# Patient Record
Sex: Male | Born: 1960 | Race: White | Hispanic: No | Marital: Married | Smoking: Never smoker
Health system: Southern US, Community
[De-identification: ages and names within clinical notes are randomized; demographics above are authoritative.]

## PROBLEM LIST (undated history)

## (undated) DIAGNOSIS — G4730 Sleep apnea, unspecified: Secondary | ICD-10-CM

## (undated) DIAGNOSIS — K5090 Crohn's disease, unspecified, without complications: Secondary | ICD-10-CM

## (undated) DIAGNOSIS — G4733 Obstructive sleep apnea (adult) (pediatric): Secondary | ICD-10-CM

## (undated) HISTORY — DX: Obstructive sleep apnea (adult) (pediatric): G47.33

## (undated) HISTORY — PX: PROSTATE SURGERY: SHX751

## (undated) HISTORY — PX: OTHER SURGICAL HISTORY: SHX169

## (undated) HISTORY — PX: NASAL SEPTUM SURGERY: SHX37

---

## 2010-07-10 DIAGNOSIS — F419 Anxiety disorder, unspecified: Secondary | ICD-10-CM | POA: Insufficient documentation

## 2013-02-18 DIAGNOSIS — I10 Essential (primary) hypertension: Secondary | ICD-10-CM | POA: Insufficient documentation

## 2018-03-15 ENCOUNTER — Other Ambulatory Visit: Payer: Self-pay

## 2018-03-15 ENCOUNTER — Encounter: Payer: Self-pay | Admitting: Emergency Medicine

## 2018-03-15 ENCOUNTER — Emergency Department
Admission: EM | Admit: 2018-03-15 | Discharge: 2018-03-15 | Disposition: A | Discharge status: Home / Self Care | Payer: BLUE CROSS/BLUE SHIELD | Attending: Emergency Medicine | Admitting: Emergency Medicine

## 2018-03-15 ENCOUNTER — Emergency Department: Payer: BLUE CROSS/BLUE SHIELD

## 2018-03-15 DIAGNOSIS — M5432 Sciatica, left side: Secondary | ICD-10-CM | POA: Diagnosis not present

## 2018-03-15 DIAGNOSIS — M25552 Pain in left hip: Secondary | ICD-10-CM | POA: Diagnosis present

## 2018-03-15 HISTORY — DX: Crohn's disease, unspecified, without complications: K50.90

## 2018-03-15 HISTORY — DX: Sleep apnea, unspecified: G47.30

## 2018-03-15 LAB — URINALYSIS, COMPLETE (UACMP) WITH MICROSCOPIC
Bacteria, UA: NONE SEEN
Bilirubin Urine: NEGATIVE
Glucose, UA: NEGATIVE mg/dL
Hgb urine dipstick: NEGATIVE
Ketones, ur: NEGATIVE mg/dL
LEUKOCYTE UA: NEGATIVE
Nitrite: NEGATIVE
PH: 5 (ref 5.0–8.0)
Protein, ur: NEGATIVE mg/dL
Specific Gravity, Urine: 1.03 (ref 1.005–1.030)

## 2018-03-15 MED ORDER — PREDNISONE 20 MG PO TABS: 60.0000 mg | ORAL_TABLET | Freq: Every day | ORAL | 0 refills | Status: AC

## 2018-03-15 MED ORDER — IBUPROFEN 600 MG PO TABS: 600.0000 mg | ORAL_TABLET | Freq: Four times a day (QID) | ORAL | 0 refills | Status: AC | PRN

## 2018-03-15 MED ORDER — CYCLOBENZAPRINE HCL 10 MG PO TABS
10.0000 mg | ORAL_TABLET | Freq: Once | ORAL | Status: AC
  Administered 2018-03-15: 10 mg via ORAL
  Filled 2018-03-15: qty 1

## 2018-03-15 MED ORDER — CYCLOBENZAPRINE HCL 10 MG PO TABS: 10.0000 mg | ORAL_TABLET | Freq: Three times a day (TID) | ORAL | 0 refills | Status: AC | PRN

## 2018-03-15 MED ORDER — KETOROLAC TROMETHAMINE 60 MG/2ML IM SOLN
30.0000 mg | Freq: Once | INTRAMUSCULAR | Status: AC
  Administered 2018-03-15: 30 mg via INTRAMUSCULAR
  Filled 2018-03-15: qty 2

## 2018-03-15 MED ORDER — PREDNISONE 20 MG PO TABS
60.0000 mg | ORAL_TABLET | Freq: Once | ORAL | Status: AC
  Administered 2018-03-15: 60 mg via ORAL
  Filled 2018-03-15: qty 3

## 2018-03-15 MED ORDER — ACETAMINOPHEN 500 MG PO TABS
1000.0000 mg | ORAL_TABLET | Freq: Once | ORAL | Status: AC
  Administered 2018-03-15: 1000 mg via ORAL
  Filled 2018-03-15: qty 2

## 2018-03-15 NOTE — ED Provider Notes (Signed)
[facility] Emergency Department Provider Note  ____________________________________________  Time seen: Approximately 1:21 AM  I have reviewed the triage vital signs and the nursing notes.   HISTORY  Chief Complaint Hip Pain   HPI Gary Solis is a 58 y.o. male with a history of Crohn's disease, enlarged prostate, sleep apnea who presents for evaluation of left hip pain.  Patient reports the pain started yesterday while he was at work.  No strenuous activities.  The pain is located in the left buttock and radiating down the left leg. The pain has been constant and progressively worse.  At this time is severe.  No back pain, no flank pain, no dysuria or hematuria, no nausea or vomiting, no saddle anesthesia, no lower extremity weakness or numbness, no urinary or bowel incontinence or retention.  Patient reports having had similar pain in the past however never this severe.  Past Medical History:  Diagnosis Date  . Crohn disease ([place])   . Sleep apnea     There are no active problems to display for this patient.   History reviewed. No pertinent surgical history.  Prior to Admission medications   Medication Sig Start Date End Date Taking? Authorizing Provider  cyclobenzaprine (FLEXERIL) 10 MG tablet Take 1 tablet (10 mg total) by mouth 3 (three) times daily as needed for muscle spasms. 03/15/18   Rudene Re, MD  ibuprofen (ADVIL,MOTRIN) 600 MG tablet Take 1 tablet (600 mg total) by mouth every 6 (six) hours as needed. 03/15/18   Rudene Re, MD  predniSONE (DELTASONE) 20 MG tablet Take 3 tablets (60 mg total) by mouth daily for 4 days. 03/15/18 03/19/18  Rudene Re, MD    Allergies Lidocaine  No family history on file.  Social History Social History   Tobacco Use  . Smoking status: Never Smoker  . Smokeless tobacco: Never Used  Substance Use Topics  . Alcohol use: Never    Frequency: Never  . Drug use: Not on file    Review  of Systems  Constitutional: Negative for fever. Eyes: Negative for visual changes. ENT: Negative for sore throat. Neck: No neck pain  Cardiovascular: Negative for chest pain. Respiratory: Negative for shortness of breath. Gastrointestinal: Negative for abdominal pain, vomiting or diarrhea. Genitourinary: Negative for dysuria. Musculoskeletal: Negative for back pain. + left hip pain Skin: Negative for rash. Neurological: Negative for headaches, weakness or numbness. Psych: No SI or HI  ____________________________________________   PHYSICAL EXAM:  VITAL SIGNS: ED Triage Vitals  Enc Vitals Group     BP 03/15/18 0026 123/75     Pulse Rate 03/15/18 0026 90     Resp 03/15/18 0026 18     Temp 03/15/18 0026 98.4 F (36.9 C)     Temp Source 03/15/18 0026 Oral     SpO2 03/15/18 0026 97 %     Weight 03/15/18 0021 225 lb (102.1 kg)     Height 03/15/18 0021 6' 4"  (1.93 m)     Head Circumference --      Peak Flow --      Pain Score 03/15/18 0021 10     Pain Loc --      Pain Edu? --      Excl. in [place]? --     Constitutional: Alert and oriented. Well appearing and in no apparent distress. HEENT:      Head: Normocephalic and atraumatic.         Eyes: Conjunctivae are normal. Sclera is non-icteric.  Mouth/Throat: Mucous membranes are moist.       Neck: Supple with no signs of meningismus. Cardiovascular: Regular rate and rhythm. No murmurs, gallops, or rubs. 2+ symmetrical distal pulses are present in all extremities. No JVD. Respiratory: Normal respiratory effort. Lungs are clear to auscultation bilaterally. No wheezes, crackles, or rhonchi.  Gastrointestinal: Soft, non tender, and non distended with positive bowel sounds. No rebound or guarding. Musculoskeletal: No CT and L-spine tenderness, patient is tender to palpation of the sciatic notch on the left, positive straight leg raise test.  No edema, erythema, or cyanosis of his extremities neurologic: Normal speech and language.  Face is symmetric. Moving all extremities. DTRs 2+ bilateral patella. No gross focal neurologic deficits are appreciated. Skin: Skin is warm, dry and intact. No rash noted. Psychiatric: Mood and affect are normal. Speech and behavior are normal.  ____________________________________________   LABS (all labs ordered are listed, but only abnormal results are displayed)  Labs Reviewed  URINALYSIS, COMPLETE (UACMP) WITH MICROSCOPIC - Abnormal; Notable for the following components:      Result Value   Color, Urine YELLOW (*)    APPearance CLEAR (*)    All other components within normal limits   ____________________________________________  EKG  none  ____________________________________________  RADIOLOGY  I have personally reviewed the images performed during this visit and I agree with the Radiologist's read.   Interpretation by Radiologist:  Dg Sacrum/coccyx  Result Date: 03/15/2018 CLINICAL DATA:  back pain EXAM: SACRUM AND COCCYX - 2+ VIEW COMPARISON:  None. FINDINGS: There is no evidence of fracture or other focal bone lesions. IMPRESSION: Negative. Electronically Signed   By: Donavan Foil M.D.   On: 03/15/2018 01:45   Dg Hip Unilat W Or Wo Pelvis 2-3 Views Left  Result Date: 03/15/2018 CLINICAL DATA:  Low back pain and left hip pain EXAM: DG HIP (WITH OR WITHOUT PELVIS) 2-3V LEFT COMPARISON:  None. FINDINGS: There is no evidence of hip fracture or dislocation. There is no evidence of arthropathy or other focal bone abnormality. IMPRESSION: Negative. Electronically Signed   By: Donavan Foil M.D.   On: 03/15/2018 01:43      ____________________________________________   PROCEDURES  Procedure(s) performed: None Procedures Critical Care performed:  None ____________________________________________   INITIAL IMPRESSION / ASSESSMENT AND PLAN / ED COURSE  58 y.o. male with a history of Crohn's disease, enlarged prostate, sleep apnea who presents for evaluation of left  hip pain.  Presentation concerning for sciatica with tenderness to palpation over the sciatic notch and pain that starts in the left buttock and radiates down to the left leg.  No clinical signs or symptoms of cauda equina.  No midline CT and L-spine tenderness.  Will give prednisone, IM Toradol, Tylenol and Flexeril for pain.  X-rays of sacrum and pelvis with no acute findings.  Patient was given a prescription for ibuprofen, Flexeril, and prednisone.  Discussed return precautions and close follow-up with primary care doctor.      As part of my medical decision making, I reviewed the following data within the [place] notes reviewed and incorporated, Labs reviewed , Old chart reviewed, Radiograph reviewed , Notes from prior ED visits and [place] Controlled Substance Database    Pertinent labs & imaging results that were available during my care of the patient were reviewed by me and considered in my medical decision making (see chart for details).    ____________________________________________   FINAL CLINICAL IMPRESSION(S) / ED DIAGNOSES  Final diagnoses:  Sciatica of left side      NEW MEDICATIONS STARTED DURING THIS VISIT:  ED Discharge Orders         Ordered    predniSONE (DELTASONE) 20 MG tablet  Daily     03/15/18 0206    cyclobenzaprine (FLEXERIL) 10 MG tablet  3 times daily PRN     03/15/18 0206    ibuprofen (ADVIL,MOTRIN) 600 MG tablet  Every 6 hours PRN     03/15/18 0206           Note:  This document was prepared using Dragon voice recognition software and may include unintentional dictation errors.    Rudene Re, MD 03/15/18 [phone]

## 2018-03-15 NOTE — ED Triage Notes (Signed)
Patient ambulatory to triage with steady gait, without difficulty or distress noted, aid of walker; c/o left hip pain radiating down leg into foot

## 2019-01-12 ENCOUNTER — Encounter: Payer: Self-pay | Admitting: Emergency Medicine

## 2019-01-12 ENCOUNTER — Emergency Department
Admission: EM | Admit: 2019-01-12 | Discharge: 2019-01-12 | Disposition: A | Discharge status: Home / Self Care | Payer: BC Managed Care – PPO | Attending: Emergency Medicine | Admitting: Emergency Medicine

## 2019-01-12 ENCOUNTER — Emergency Department: Payer: BC Managed Care – PPO

## 2019-01-12 ENCOUNTER — Other Ambulatory Visit: Payer: Self-pay

## 2019-01-12 DIAGNOSIS — J189 Pneumonia, unspecified organism: Secondary | ICD-10-CM | POA: Insufficient documentation

## 2019-01-12 DIAGNOSIS — Z20822 Contact with and (suspected) exposure to covid-19: Secondary | ICD-10-CM

## 2019-01-12 DIAGNOSIS — U071 COVID-19: Secondary | ICD-10-CM | POA: Insufficient documentation

## 2019-01-12 DIAGNOSIS — R05 Cough: Secondary | ICD-10-CM | POA: Diagnosis present

## 2019-01-12 MED ORDER — CEFTRIAXONE SODIUM 1 G IJ SOLR
1.0000 g | Freq: Once | INTRAMUSCULAR | Status: AC
  Administered 2019-01-12: 19:00:00 1 g via INTRAMUSCULAR
  Filled 2019-01-12: qty 10

## 2019-01-12 MED ORDER — BENZONATATE 100 MG PO CAPS: 100.0000 mg | ORAL_CAPSULE | Freq: Three times a day (TID) | ORAL | 0 refills | Status: AC | PRN

## 2019-01-12 MED ORDER — AZITHROMYCIN 250 MG PO TABS: ORAL_TABLET | ORAL | 0 refills | Status: AC

## 2019-01-12 NOTE — ED Provider Notes (Signed)
[facility] Emergency Department Provider Note  ____________________________________________  Time seen: Approximately 6:58 PM  I have reviewed the triage vital signs and the nursing notes.   HISTORY  Chief Complaint Cough    HPI Gary Solis is a 59 y.o. male that presents to the emergency department for evaluation of body aches and nonproductive cough for 3 days.  Patient states that he started feeling a little achy on December 25 but symptoms really started on New Year's Eve.  Patient is otherwise healthy.  He does not smoke.  No known contacts with COVID-19.  He presents to the emergency department today with his wife with similar symptoms.  He is unaware of a fever.  No headache, shortness of breath, chest pain, vomiting, abdominal pain, diarrhea.  Past Medical History:  Diagnosis Date  . Crohn disease ([place])   . Sleep apnea     There are no problems to display for this patient.   History reviewed. No pertinent surgical history.  Prior to Admission medications   Medication Sig Start Date End Date Taking? Authorizing Provider  azithromycin (ZITHROMAX Z-PAK) 250 MG tablet Take 2 tablets (500 mg) on  Day 1,  followed by 1 tablet (250 mg) once daily on Days 2 through 5. 01/13/19   Laban Emperor, PA-C  benzonatate (TESSALON PERLES) 100 MG capsule Take 1 capsule (100 mg total) by mouth 3 (three) times daily as needed. 01/12/19 01/12/20  Laban Emperor, PA-C  cyclobenzaprine (FLEXERIL) 10 MG tablet Take 1 tablet (10 mg total) by mouth 3 (three) times daily as needed for muscle spasms. 03/15/18   Rudene Re, MD  ibuprofen (ADVIL,MOTRIN) 600 MG tablet Take 1 tablet (600 mg total) by mouth every 6 (six) hours as needed. 03/15/18   Rudene Re, MD    Allergies Lidocaine  No family history on file.  Social History Social History   Tobacco Use  . Smoking status: Never Smoker  . Smokeless tobacco: Never Used  Substance Use Topics  . Alcohol use:  Never  . Drug use: Not on file     Review of Systems  Constitutional: No fever/chills Eyes: No visual changes. No discharge. ENT: Negative for congestion and rhinorrhea. Cardiovascular: No chest pain. Respiratory: Positive for cough. No SOB. Gastrointestinal: No abdominal pain.  No nausea, no vomiting.  No diarrhea.  No constipation. Musculoskeletal: Negative for musculoskeletal pain. Skin: Negative for rash, abrasions, lacerations, ecchymosis. Neurological: Negative for headaches.   ____________________________________________   PHYSICAL EXAM:  VITAL SIGNS: ED Triage Vitals  Enc Vitals Group     BP 01/12/19 1624 137/70     Pulse Rate 01/12/19 1624 (!) 109     Resp 01/12/19 1624 20     Temp 01/12/19 1624 99.1 F (37.3 C)     Temp Source 01/12/19 1624 Oral     SpO2 01/12/19 1624 95 %     Weight 01/12/19 1630 232 lb (105.2 kg)     Height 01/12/19 1630 6' 4"  (1.93 m)     Head Circumference --      Peak Flow --      Pain Score 01/12/19 1630 6     Pain Loc --      Pain Edu? --      Excl. in [place]? --      Constitutional: Alert and oriented. Well appearing and in no acute distress. Eyes: Conjunctivae are normal. PERRL. EOMI. No discharge. Head: Atraumatic. ENT: No frontal and maxillary sinus tenderness.      Ears:  Tympanic membranes pearly gray with good landmarks. No discharge.      Nose: No congestion/rhinnorhea.      Mouth/Throat: Mucous membranes are moist. Oropharynx non-erythematous. Tonsils not enlarged. No exudates. Uvula midline. Neck: No stridor.   Hematological/Lymphatic/Immunilogical: No cervical lymphadenopathy. Cardiovascular: Normal rate, regular rhythm.  Good peripheral circulation. Respiratory: Normal respiratory effort without tachypnea or retractions. Lungs CTAB. Good air entry to the bases with no decreased or absent breath sounds. Gastrointestinal: Bowel sounds 4 quadrants. Soft and nontender to palpation. No guarding or rigidity. No palpable masses.  No distention. Musculoskeletal: Full range of motion to all extremities. No gross deformities appreciated. Neurologic:  Normal speech and language. No gross focal neurologic deficits are appreciated.  Skin:  Skin is warm, dry and intact. No rash noted. Psychiatric: Mood and affect are normal. Speech and behavior are normal. Patient exhibits appropriate insight and judgement.   ____________________________________________   LABS (all labs ordered are listed, but only abnormal results are displayed)  Labs Reviewed  SARS CORONAVIRUS 2 (TAT 6-24 HRS)   ____________________________________________  EKG   ____________________________________________  RADIOLOGY Robinette Haines, personally viewed and evaluated these images (plain radiographs) as part of my medical decision making, as well as reviewing the written report by the radiologist.  DG Chest Portable 1 View  Result Date: 01/12/2019 CLINICAL DATA:  Cough. Additional provided: Cough beginning 01/04/2019. EXAM: PORTABLE CHEST 1 VIEW COMPARISON:  No pertinent prior studies available for comparison. FINDINGS: Heart size within normal limits. Mild ill-defined opacity at the right lung base may reflect atelectasis or early pneumonia. The left lung is clear. No evidence of pleural effusion or pneumothorax. No acute bony abnormality. IMPRESSION: Ill-defined opacity at the right lung base which may reflect atelectasis, but is suspicious for early pneumonia given provided history. Electronically Signed   By: Kellie Simmering DO   On: 01/12/2019 17:59    ____________________________________________    PROCEDURES  Procedure(s) performed:    Procedures    Medications  cefTRIAXone (ROCEPHIN) injection 1 g (1 g Intramuscular Given 01/12/19 1912)     ____________________________________________   INITIAL IMPRESSION / ASSESSMENT AND PLAN / ED COURSE  Pertinent labs & imaging results that were available during my care of the patient were  reviewed by me and considered in my medical decision making (see chart for details).  Review of the [place] CSRS was performed in accordance of the [place] prior to dispensing any controlled drugs.   Patient's diagnosis is consistent with community acquired pneumonia. Vital signs and exam are reassuring.  X-ray consistent with possible early right-sided pneumonia.  Covid test is pending.  Patient denies any shortness of breath or chest pain.  Patient presents with his wife who also has URI symptoms.  Patient appears well and is staying well hydrated. Patient feels comfortable going home.  Patient was given IM ceftriaxone in the emergency department.  Patient will be discharged home with prescriptions for azithromycin and Tessalon Perles. Patient is to follow up with primary care as needed or otherwise directed. Patient is given ED precautions to return to the ED for any worsening or new symptoms.   Kiah Keay was evaluated in Emergency Department on 01/12/2019 for the symptoms described in the history of present illness. He was evaluated in the context of the global COVID-19 pandemic, which necessitated consideration that the patient might be at risk for infection with the SARS-CoV-2 virus that causes COVID-19. Institutional protocols and algorithms that pertain to the evaluation of patients at risk for COVID-19 are  in a state of rapid change based on information released by regulatory bodies including the CDC and federal and state organizations. These policies and algorithms were followed during the patient's care in the ED.  ____________________________________________  FINAL CLINICAL IMPRESSION(S) / ED DIAGNOSES  Final diagnoses:  Community acquired pneumonia of right lower lobe of lung  Suspected COVID-19 virus infection      NEW MEDICATIONS STARTED DURING THIS VISIT:  ED Discharge Orders         Ordered    azithromycin (ZITHROMAX Z-PAK) 250 MG tablet     01/12/19 1930    benzonatate  (TESSALON PERLES) 100 MG capsule  3 times daily PRN     01/12/19 1930              This chart was dictated using voice recognition software/Dragon. Despite best efforts to proofread, errors can occur which can change the meaning. Any change was purely unintentional.    Laban Emperor, PA-C 01/12/19 2344    Duffy Bruce, MD 01/14/19 [phone]

## 2019-01-12 NOTE — Discharge Instructions (Addendum)
Your chest x-ray shows that you may be starting to develop some pneumonia.   I suspect that you have Covid and that your pneumonia is due to Covid, rather than a bacterial pneumonia. Please begin the azithromycin in the morning to cover you for a possible bacterial pneumonia.  I have given you a dose of antibiotics here in the emergency department.  Please call your primary care on Monday for a follow-up appointment.  Return the emergency department for worsening of symptoms.

## 2019-01-12 NOTE — ED Triage Notes (Signed)
Cough since 12/25

## 2019-01-13 LAB — SARS CORONAVIRUS 2 (TAT 6-24 HRS): SARS Coronavirus 2: POSITIVE — AB

## 2019-01-14 ENCOUNTER — Telehealth: Payer: Self-pay | Admitting: Emergency Medicine

## 2019-01-14 NOTE — Telephone Encounter (Signed)
Called patient to assure he is aware of covid.  Says  He is aware as pcp and health dept called him.

## 2019-10-01 ENCOUNTER — Ambulatory Visit (INDEPENDENT_AMBULATORY_CARE_PROVIDER_SITE_OTHER): Payer: BC Managed Care – PPO | Admitting: Internal Medicine

## 2019-10-01 DIAGNOSIS — Z9989 Dependence on other enabling machines and devices: Secondary | ICD-10-CM | POA: Diagnosis not present

## 2019-10-01 DIAGNOSIS — Z7189 Other specified counseling: Secondary | ICD-10-CM

## 2019-10-01 DIAGNOSIS — G4733 Obstructive sleep apnea (adult) (pediatric): Secondary | ICD-10-CM | POA: Diagnosis not present

## 2019-10-01 MED ORDER — ARMODAFINIL 200 MG PO TABS: 1.0000 | ORAL_TABLET | Freq: Every day | ORAL | 1 refills | Status: DC

## 2019-10-01 MED ORDER — MODAFINIL 200 MG PO TABS: 200.0000 mg | ORAL_TABLET | Freq: Every day | ORAL | 1 refills | Status: DC

## 2019-10-01 NOTE — Progress Notes (Signed)
[facility] [place], [place] [address]  Pulmonary Sleep Medicine   Office Visit Note  Patient Name: Gary Solis DOB: 02-03-1960 MRN [id number]    Chief Complaint: Obstructive Sleep Apnea/Medication visit  Brief History:  [place] is seen today for follow up The patient has a 11 year history of sleep apnea. Patient is using PAP nightly.  He sometimes he wakes with the mask off.  He goes to bed around 10-10:30 p.m. and wakes a little after 5 p.m.The patient feels more rested after sleeping with PAP.  The patient reports benefiting  from PAP use. Reported sleepiness is  improved and the Epworth Sleepiness Score is 4 out of 24. The patient rarely take naps. The patient complains of the following: no complaints  The compliance is not available The patient does not complain of limb movements disrupting sleep. The patient takes Nuvigil 200 mg qam for hypersomnia. He denies any side effects. ROS  General: (-) fever, (-) chills, (-) night sweat Nose and Sinuses: (-) nasal stuffiness or itchiness, (-) postnasal drip, (-) nosebleeds, (-) sinus trouble. Mouth and Throat: (-) sore throat, (-) hoarseness. Neck: (-) swollen glands, (-) enlarged thyroid, (-) neck pain. Respiratory: - cough, - shortness of breath, - wheezing. Neurologic: - numbness, - tingling. Psychiatric: - anxiety, - depression   Current Medication: Outpatient Encounter Medications as of 10/01/2019  Medication Sig  . azithromycin (ZITHROMAX Z-PAK) 250 MG tablet Take 2 tablets (500 mg) on  Day 1,  followed by 1 tablet (250 mg) once daily on Days 2 through 5.  . benzonatate (TESSALON PERLES) 100 MG capsule Take 1 capsule (100 mg total) by mouth 3 (three) times daily as needed.  . cyclobenzaprine (FLEXERIL) 10 MG tablet Take 1 tablet (10 mg total) by mouth 3 (three) times daily as needed for muscle spasms.  Marland Kitchen ibuprofen (ADVIL,MOTRIN) 600 MG tablet Take 1 tablet (600 mg total) by mouth every 6 (six) hours  as needed.   No facility-administered encounter medications on file as of 10/01/2019.    Surgical History: No past surgical history on file.  Medical History: Past Medical History:  Diagnosis Date  . Crohn disease ([place])   . Sleep apnea     Family History: Non contributory to the present illness  Social History: Social History   Socioeconomic History  . Marital status: Married    Spouse name: Not on file  . Number of children: Not on file  . Years of education: Not on file  . Highest education level: Not on file  Occupational History  . Not on file  Tobacco Use  . Smoking status: Never Smoker  . Smokeless tobacco: Never Used  Substance and Sexual Activity  . Alcohol use: Never  . Drug use: Not on file  . Sexual activity: Not on file  Other Topics Concern  . Not on file  Social History Narrative  . Not on file   Social Determinants of Health   Financial Resource Strain:   . Difficulty of Paying Living Expenses: Not on file  Food Insecurity:   . Worried About Charity fundraiser in the Last Year: Not on file  . Ran Out of Food in the Last Year: Not on file  Transportation Needs:   . Lack of Transportation (Medical): Not on file  . Lack of Transportation (Non-Medical): Not on file  Physical Activity:   . Days of Exercise per Week: Not on file  . Minutes of Exercise per Session: Not on  file  Stress:   . Feeling of Stress : Not on file  Social Connections:   . Frequency of Communication with Friends and Family: Not on file  . Frequency of Social Gatherings with Friends and Family: Not on file  . Attends Religious Services: Not on file  . Active Member of Clubs or Organizations: Not on file  . Attends Archivist Meetings: Not on file  . Marital Status: Not on file  Intimate Partner Violence:   . Fear of Current or Ex-Partner: Not on file  . Emotionally Abused: Not on file  . Physically Abused: Not on file  . Sexually Abused: Not on file    Vital  Signs: There were no vitals taken for this visit.  Examination: General Appearance: The patient is well-developed, well-nourished, and in no distress. Neck Circumference: 40 Skin: Gross inspection of skin unremarkable. Head: normocephalic, no gross deformities. Eyes: no gross deformities noted. ENT: ears appear grossly normal Neurologic: Alert and oriented. No involuntary movements.    EPWORTH SLEEPINESS SCALE:  Scale:  (0)= no chance of dozing; (1)= slight chance of dozing; (2)= moderate chance of dozing; (3)= high chance of dozing  Chance  Situtation    Sitting and reading: 1    Watching TV: 1    Sitting Inactive in public: 1    As a passenger in car: 1      Lying down to rest: 0    Sitting and talking: 0    Sitting quielty after lunch: 0    In a car, stopped in traffic: 0   TOTAL SCORE:   4 out of 24    SLEEP STUDIES:  1. Split 05/2007 AH 37 SpO45mn 70%   CPAP COMPLIANCE DATA:  Not available        LABS: No results found for this or any previous visit (from the past 2160 hour(s)).  Radiology: DG Chest Portable 1 View  Result Date: 01/12/2019 CLINICAL DATA:  Cough. Additional provided: Cough beginning 01/04/2019. EXAM: PORTABLE CHEST 1 VIEW COMPARISON:  No pertinent prior studies available for comparison. FINDINGS: Heart size within normal limits. Mild ill-defined opacity at the right lung base may reflect atelectasis or early pneumonia. The left lung is clear. No evidence of pleural effusion or pneumothorax. No acute bony abnormality. IMPRESSION: Ill-defined opacity at the right lung base which may reflect atelectasis, but is suspicious for early pneumonia given provided history. Electronically Signed   By: KKellie SimmeringDO   On: 01/12/2019 17:59    No results found.  No results found.    Assessment and Plan: There are no problems to display for this patient.     The patient does tolerate PAP for the most part. He still removes his mask  during the night some nights.  He will try practicing with the CPAP during the day. He reports significant benefit from PAP use. The patient was reminded how to clean and advised to ensure adequate nightly sleep. The patient was also counselled on the importance of diet and exercise. The compliance is not available. He is taking Provigil for hypersomnia and this has been very effective.   1. OSA- increase CPAP usage and practice with CPAP 30 minutes per day. 2. Hypersomnia- Continue Provigil 200 mg qam. Follow up in 6 months 3. CPAP counseling provided 4. Obesity diet and exercise discussed  General Counseling: I have discussed the findings of the evaluation and [facility] with [facility]  I have also discussed any further diagnostic evaluation thatmay  be needed or ordered today. Jermain verbalizes understanding of the findings of todays visit. We also reviewed his medications today and discussed drug interactions and side effects including but not limited excessive drowsiness and altered mental states. We also discussed that there is always a risk not just to him but also people around him. he has been encouraged to call the office with any questions or concerns that should arise related to todays visit.  No orders of the defined types were placed in this encounter.       I have personally obtained a history, examined the patient, evaluated laboratory and imaging results, formulated the assessment and plan and placed orders.   Richelle Ito Saunders Glance, PhD, FAASM  Diplomate, American Board of Sleep Medicine    Allyne Gee, MD [facility] Diplomate ABMS Pulmonary and Critical Care Medicine Sleep medicine

## 2020-03-27 NOTE — Progress Notes (Unsigned)
[facility] [place], [place] [address]  Pulmonary Sleep Medicine   Office Visit Note  Patient Name: Gary Solis DOB: 07-Oct-1960 MRN [id number]    Chief Complaint: Obstructive Sleep Apnea visit  Brief History:  Filbert is seen today for follow up The patient has a 6 year history of sleep apnea. Patient is using PAP nightly.  He reports averaging 6-7 hours of sleep The patient feels more rested after sleeping with PAP.  The patient reports benefiting from PAP use. He is still waking up with the mask on the floor. He is still taking armodafinil 200 mg qam. Reported sleepiness is  improved and the Epworth Sleepiness Score is 1 out of 24. The patient does not take naps. The patient complains of the following: no complaints.  The compliance is not available. The patient does not complain of limb movements disrupting sleep.  ROS  General: (-) fever, (-) chills, (-) night sweat Nose and Sinuses: (-) nasal stuffiness or itchiness, (-) postnasal drip, (-) nosebleeds, (-) sinus trouble. Mouth and Throat: (-) sore throat, (-) hoarseness. Neck: (-) swollen glands, (-) enlarged thyroid, (-) neck pain. Respiratory: - cough, - shortness of breath, - wheezing. Neurologic: - numbness, - tingling. Psychiatric: - anxiety, - depression   Current Medication: Outpatient Encounter Medications as of 03/31/2020  Medication Sig  . amitriptyline (ELAVIL) 25 MG tablet Take by mouth.  . Armodafinil (NUVIGIL) 200 MG TABS Take 1 tablet by mouth daily.  Marland Kitchen azaTHIOprine (IMURAN) 50 MG tablet Take by mouth.  Marland Kitchen azithromycin (ZITHROMAX Z-PAK) 250 MG tablet Take 2 tablets (500 mg) on  Day 1,  followed by 1 tablet (250 mg) once daily on Days 2 through 5.  . cyclobenzaprine (FLEXERIL) 10 MG tablet Take 1 tablet (10 mg total) by mouth 3 (three) times daily as needed for muscle spasms.  Marland Kitchen ibuprofen (ADVIL,MOTRIN) 600 MG tablet Take 1 tablet (600 mg total) by mouth every 6 (six) hours as  needed.  Marland Kitchen lisinopril (ZESTRIL) 10 MG tablet Take 10 mg by mouth daily.  Marland Kitchen sulfaSALAzine (AZULFIDINE) 500 MG tablet Take by mouth.  . [DISCONTINUED] Armodafinil 200 MG TABS Take 1 tablet by mouth every morning.  . [DISCONTINUED] modafinil (PROVIGIL) 200 MG tablet Take 1 tablet (200 mg total) by mouth daily.   No facility-administered encounter medications on file as of 03/31/2020.    Surgical History: Past Surgical History:  Procedure Laterality Date  . hemmorrhoid    . NASAL SEPTUM SURGERY    . PROSTATE SURGERY      Medical History: Past Medical History:  Diagnosis Date  . Crohn disease ([place])   . OSA on CPAP   . Sleep apnea     Family History: Non contributory to the present illness  Social History: Social History   Socioeconomic History  . Marital status: Married    Spouse name: Not on file  . Number of children: Not on file  . Years of education: Not on file  . Highest education level: Not on file  Occupational History  . Not on file  Tobacco Use  . Smoking status: Never Smoker  . Smokeless tobacco: Never Used  Substance and Sexual Activity  . Alcohol use: Never  . Drug use: Never  . Sexual activity: Not on file  Other Topics Concern  . Not on file  Social History Narrative  . Not on file   Social Determinants of Health   Financial Resource Strain: Not on file  Food Insecurity: Not  on file  Transportation Needs: Not on file  Physical Activity: Not on file  Stress: Not on file  Social Connections: Not on file  Intimate Partner Violence: Not on file    Vital Signs: Blood pressure (!) 142/86, pulse 91, temperature 98.6 F (37 C), resp. rate 18, height 6' 4"  (1.93 m), weight 229 lb (103.9 kg), SpO2 96 %.  Examination: General Appearance: The patient is well-developed, well-nourished, and in no distress. Neck Circumference: 43 cm Skin: Gross inspection of skin unremarkable. Head: normocephalic, no gross deformities. Eyes: no gross deformities  noted. ENT: ears appear grossly normal Neurologic: Alert and oriented. No involuntary movements.    EPWORTH SLEEPINESS SCALE:  Scale:  (0)= no chance of dozing; (1)= slight chance of dozing; (2)= moderate chance of dozing; (3)= high chance of dozing  Chance  Situtation    Sitting and reading: 0    Watching TV: 1    Sitting Inactive in public: 0    As a passenger in car: 0      Lying down to rest: 1    Sitting and talking: 0    Sitting quielty after lunch: 0    In a car, stopped in traffic: 0   TOTAL SCORE:   1 out of 24    SLEEP STUDIES:  1. Split 5/09 AHI 37 Spo73mn 70%   CPAP COMPLIANCE DATA:  Not available due to aged machine  95th Percentile Pressure: 16         LABS: No results found for this or any previous visit (from the past 2160 hour(s)).  Radiology: DG Chest Portable 1 View  Result Date: 01/12/2019 CLINICAL DATA:  Cough. Additional provided: Cough beginning 01/04/2019. EXAM: PORTABLE CHEST 1 VIEW COMPARISON:  No pertinent prior studies available for comparison. FINDINGS: Heart size within normal limits. Mild ill-defined opacity at the right lung base may reflect atelectasis or early pneumonia. The left lung is clear. No evidence of pleural effusion or pneumothorax. No acute bony abnormality. IMPRESSION: Ill-defined opacity at the right lung base which may reflect atelectasis, but is suspicious for early pneumonia given provided history. Electronically Signed   By: KKellie SimmeringDO   On: 01/12/2019 17:59    No results found.  No results found.    Assessment and Plan: Patient Active Problem List   Diagnosis Date Noted  . OSA on CPAP   . Essential hypertension 02/18/2013  . Anxiety 07/10/2010      The patient does tolerate PAP although he is still removing the mask unknowingly some nights. He does not want to switch to an oral appliance. and reports significant benefit from PAP use. The patient was urged to replace his aged CPAP as we  cannot get any information from his CPAP. The compliance is not available He continues to do well on armodafinil 200 mg qam and is in need of a refill.  1. OSA on CPAP continue to use CPAP nightly, all night.  2. CPAP use counseling CPAP couseling-Discussed importance of adequate CPAP use as well as proper care and cleaning techniques of machine and all supplies.  3. Hypersomnia Continue Armodafinil 2037mq AM, refill sent  4. Essential hypertension Mildly elevated. Continue current medication and f/u with PCP.  5. Anxiety Stable, continue current medication and f/u with PCP.   General Counseling: I have discussed the findings of the evaluation [facility] I have also discussed any further diagnostic evaluation thatmay be needed or ordered today. GlKenrickerbalizes understanding of  the findings of todays visit. We also reviewed his medications today and discussed drug interactions and side effects including but not limited excessive drowsiness and altered mental states. We also discussed that there is always a risk not just to him but also people around him. he has been encouraged to call the office with any questions or concerns that should arise related to todays visit.  No orders of the defined types were placed in this encounter.      I have personally obtained a history, examined the patient, evaluated laboratory and imaging results, formulated the assessment and plan and placed orders.  This patient was seen by Drema Dallas, PA-C in collaboration with Dr. Devona Konig as a part of collaborative care agreement.   Richelle Ito Saunders Glance, PhD, FAASM  Diplomate, American Board of Sleep Medicine    Allyne Gee, MD [facility] Diplomate ABMS Pulmonary and Critical Care Medicine Sleep medicine

## 2020-03-31 ENCOUNTER — Other Ambulatory Visit: Payer: Self-pay | Admitting: Hospice and Palliative Medicine

## 2020-03-31 ENCOUNTER — Ambulatory Visit (INDEPENDENT_AMBULATORY_CARE_PROVIDER_SITE_OTHER): Payer: BC Managed Care – PPO | Admitting: Internal Medicine

## 2020-03-31 DIAGNOSIS — I10 Essential (primary) hypertension: Secondary | ICD-10-CM | POA: Diagnosis not present

## 2020-03-31 DIAGNOSIS — G4733 Obstructive sleep apnea (adult) (pediatric): Secondary | ICD-10-CM

## 2020-03-31 DIAGNOSIS — F419 Anxiety disorder, unspecified: Secondary | ICD-10-CM

## 2020-03-31 DIAGNOSIS — Z9989 Dependence on other enabling machines and devices: Secondary | ICD-10-CM

## 2020-03-31 DIAGNOSIS — G4710 Hypersomnia, unspecified: Secondary | ICD-10-CM | POA: Diagnosis not present

## 2020-03-31 DIAGNOSIS — Z7189 Other specified counseling: Secondary | ICD-10-CM

## 2020-03-31 MED ORDER — ARMODAFINIL 200 MG PO TABS: 1.0000 | ORAL_TABLET | Freq: Every day | ORAL | 1 refills | Status: DC

## 2020-03-31 NOTE — Patient Instructions (Signed)

## 2020-04-03 MED ORDER — ARMODAFINIL 200 MG PO TABS: 1.0000 | ORAL_TABLET | Freq: Every day | ORAL | 1 refills | Status: DC

## 2020-04-07 ENCOUNTER — Other Ambulatory Visit: Payer: Self-pay | Admitting: Hospice and Palliative Medicine

## 2020-04-07 DIAGNOSIS — G4710 Hypersomnia, unspecified: Secondary | ICD-10-CM

## 2020-04-07 MED ORDER — ARMODAFINIL 200 MG PO TABS: 1.0000 | ORAL_TABLET | Freq: Every day | ORAL | 1 refills | Status: DC

## 2020-04-08 ENCOUNTER — Other Ambulatory Visit: Payer: Self-pay | Admitting: Physician Assistant

## 2020-04-08 DIAGNOSIS — G4710 Hypersomnia, unspecified: Secondary | ICD-10-CM

## 2020-04-08 MED ORDER — ARMODAFINIL 200 MG PO TABS: 1.0000 | ORAL_TABLET | Freq: Every day | ORAL | 1 refills | Status: DC

## 2020-04-09 ENCOUNTER — Other Ambulatory Visit: Payer: Self-pay | Admitting: Hospice and Palliative Medicine

## 2020-04-09 ENCOUNTER — Telehealth: Payer: Self-pay

## 2020-04-09 ENCOUNTER — Other Ambulatory Visit: Payer: Self-pay

## 2020-04-09 NOTE — Telephone Encounter (Signed)
Called [facility]  And canceled pres for Amodafinil and [facility] ok to [facility] and also advised pt that pres was ready for pickup at [facility]

## 2020-09-07 IMAGING — DX DG CHEST 1V PORT
1 series · 2 of 2 positions shown · non-contrast
Comparison: No pertinent prior studies available for comparison.

CLINICAL DATA: Cough. Additional provided: Cough beginning
01/04/2019.

EXAM:
PORTABLE CHEST 1 VIEW

[Series 1: chest ap · 0.14mm/px · 2 of 2 slices shown]
[im 1/2]
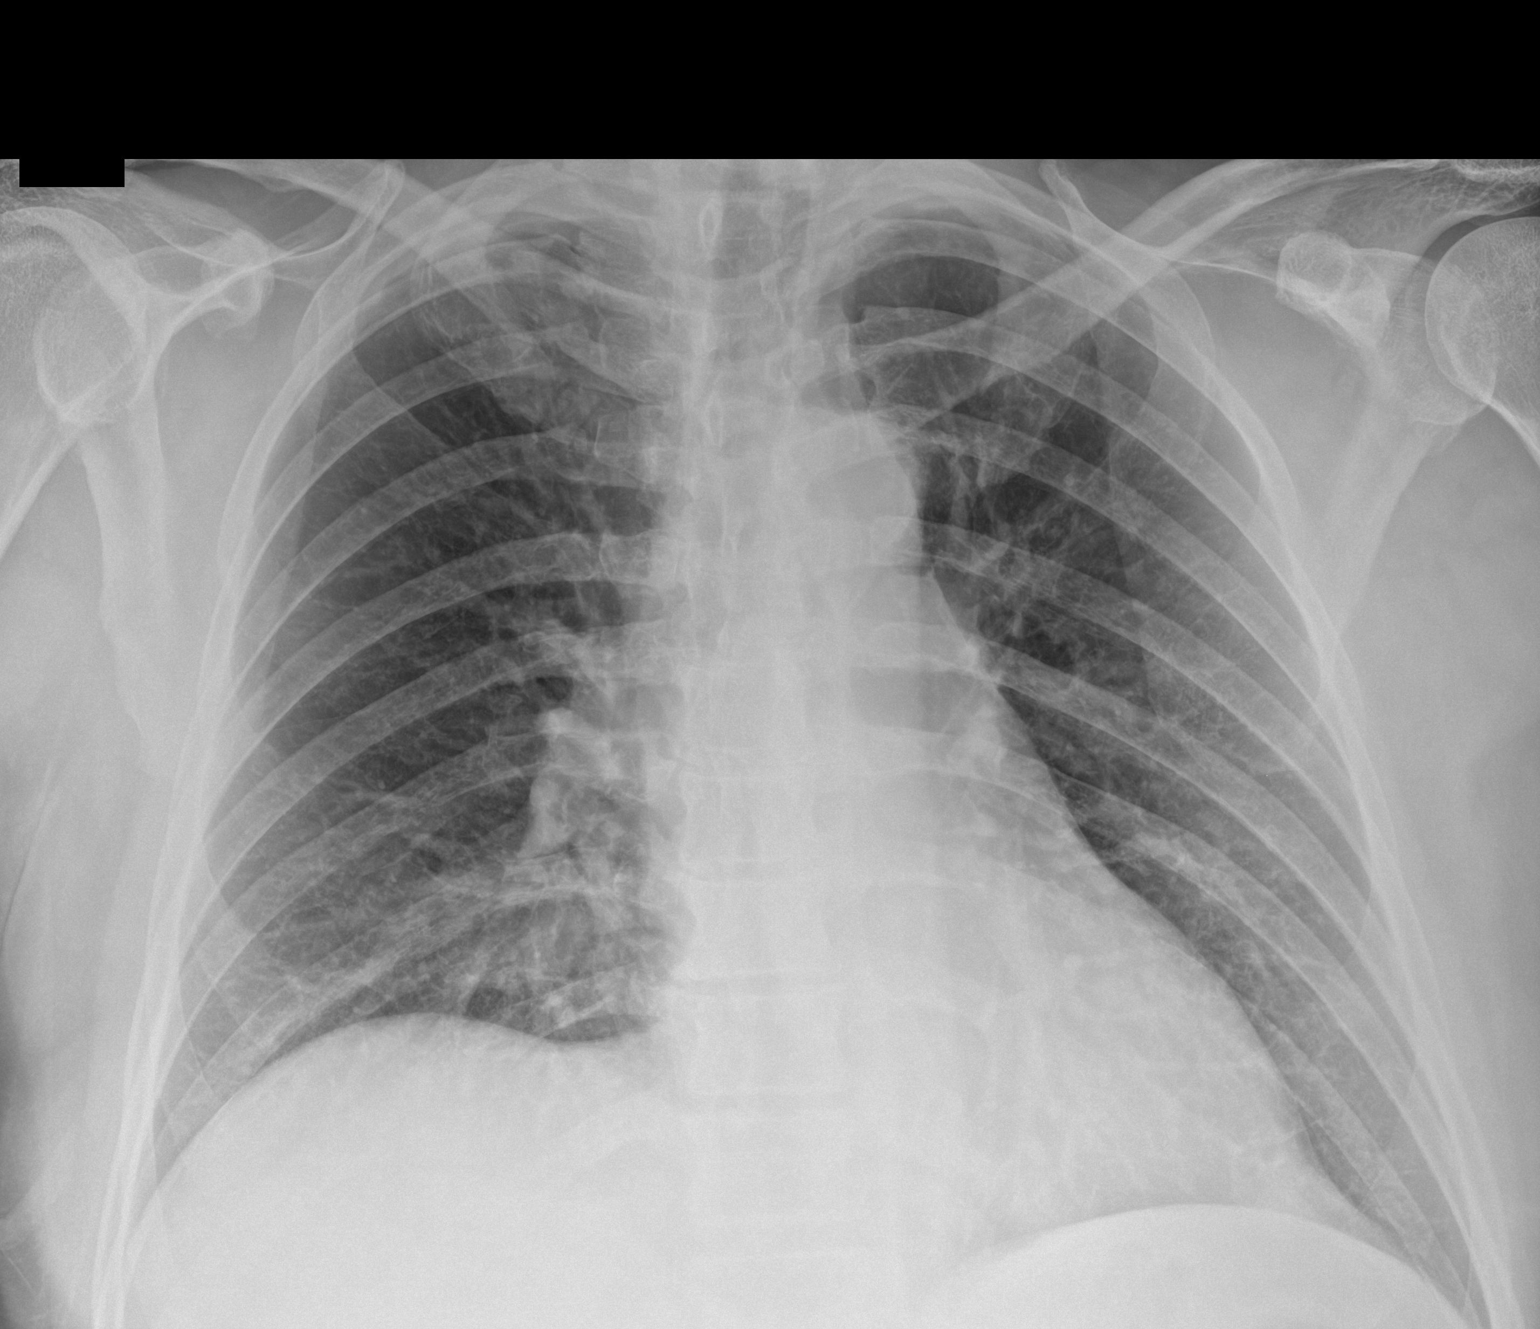
[im 2/2]
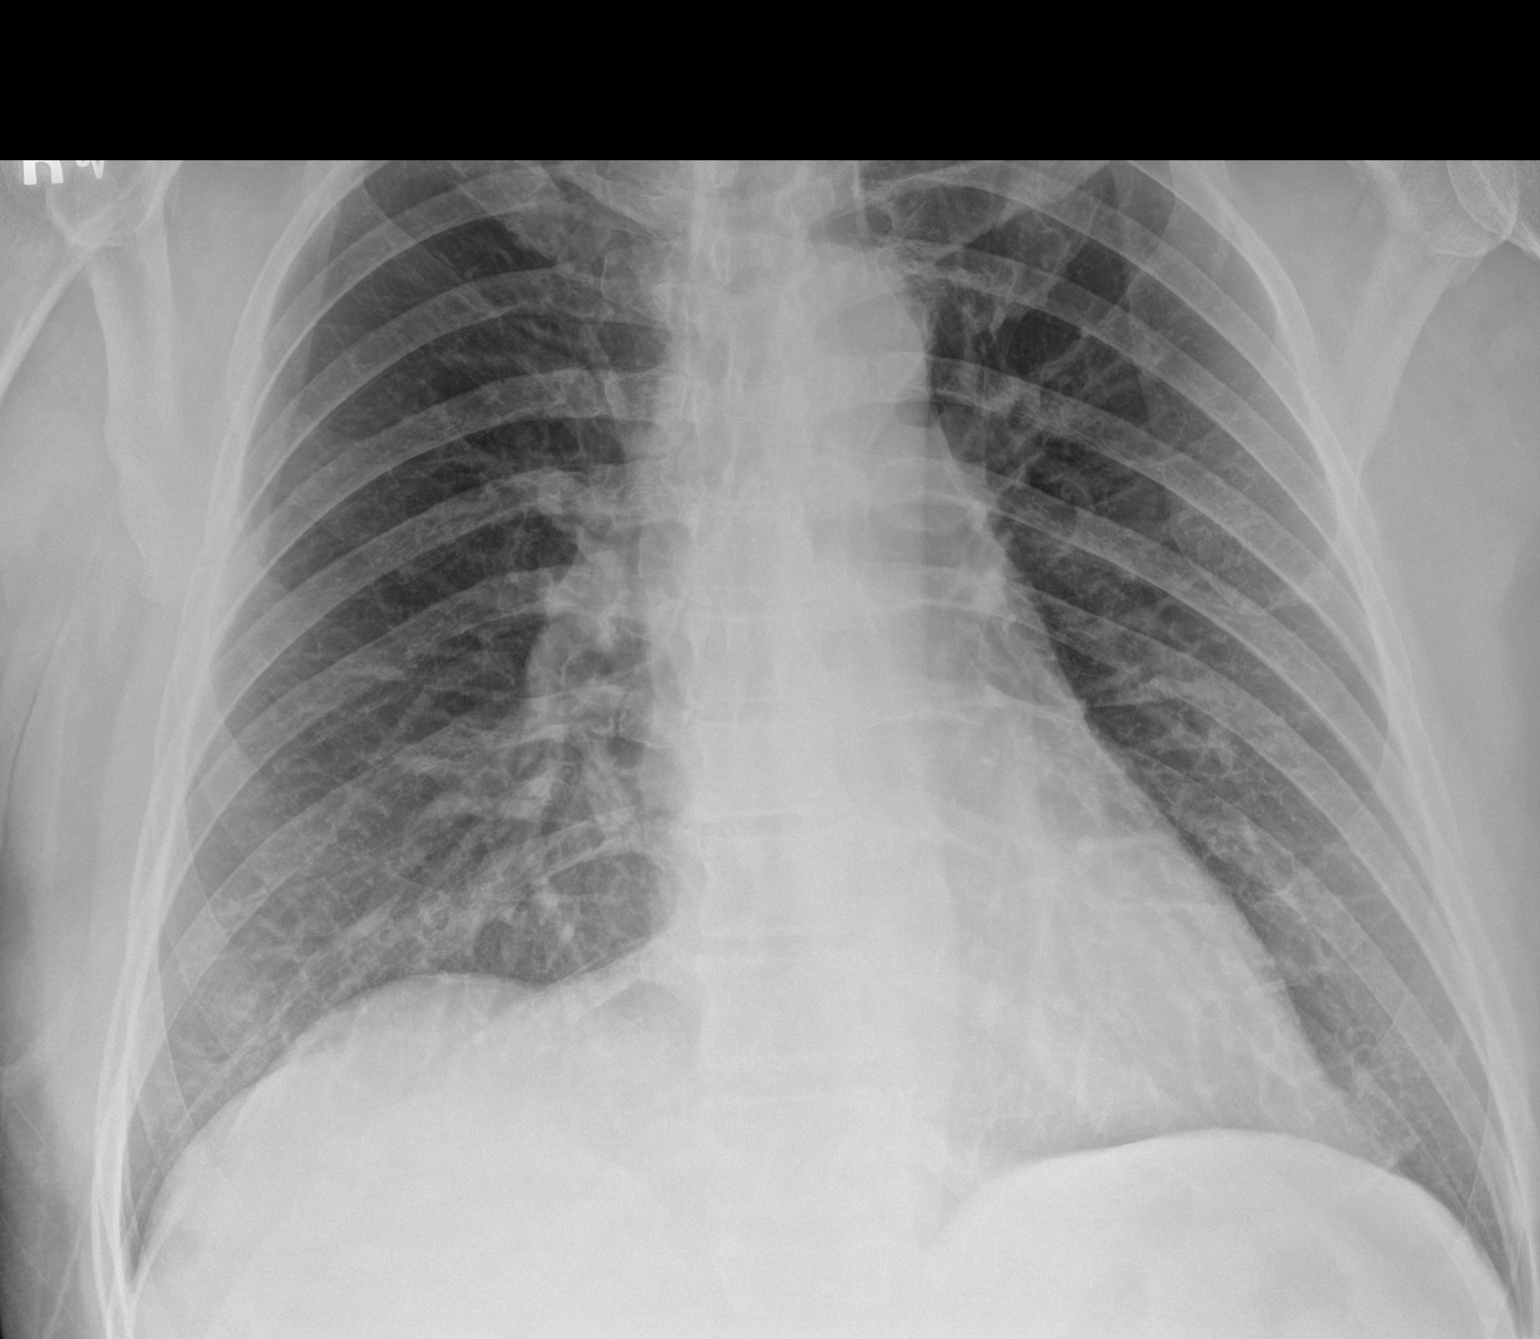

[2 of 2 positions shown; findings below may reference images not displayed]

FINDINGS: Heart size within normal limits. Mild ill-defined opacity at the
right lung base may reflect atelectasis or early pneumonia. The left
lung is clear. No evidence of pleural effusion or pneumothorax. No
acute bony abnormality.
IMPRESSION: Ill-defined opacity at the right lung base which may reflect
atelectasis, but is suspicious for early pneumonia given provided
history.

## 2020-09-30 ENCOUNTER — Ambulatory Visit (INDEPENDENT_AMBULATORY_CARE_PROVIDER_SITE_OTHER): Payer: BC Managed Care – PPO | Admitting: Internal Medicine

## 2020-09-30 VITALS — BP 156/100 | HR 110 | Resp 18 | Ht 76.0 in | Wt 236.0 lb

## 2020-09-30 DIAGNOSIS — Z7189 Other specified counseling: Secondary | ICD-10-CM

## 2020-09-30 DIAGNOSIS — G4710 Hypersomnia, unspecified: Secondary | ICD-10-CM | POA: Diagnosis not present

## 2020-09-30 DIAGNOSIS — Z9989 Dependence on other enabling machines and devices: Secondary | ICD-10-CM

## 2020-09-30 DIAGNOSIS — I10 Essential (primary) hypertension: Secondary | ICD-10-CM | POA: Diagnosis not present

## 2020-09-30 DIAGNOSIS — G4733 Obstructive sleep apnea (adult) (pediatric): Secondary | ICD-10-CM | POA: Diagnosis not present

## 2020-09-30 DIAGNOSIS — F419 Anxiety disorder, unspecified: Secondary | ICD-10-CM

## 2020-09-30 MED ORDER — ARMODAFINIL 200 MG PO TABS: 1.0000 | ORAL_TABLET | Freq: Every day | ORAL | 1 refills | Status: DC

## 2020-09-30 NOTE — Progress Notes (Signed)
[facility] [place], [place] [address]  Pulmonary Sleep Medicine   Office Visit Note  Patient Name: Gary Solis DOB: November 21, 1960 MRN [id number]    Chief Complaint: Obstructive Sleep Apnea visit  Brief History:  Shray is seen today for follow up on CPAP@16cmH20 . The patient has a 12 year history of sleep apnea. Patient is using PAP nightly.  The patient feels rested after sleeping with PAP.  The patient reports benefiting from PAP use. Reported sleepiness is  improved and the Epworth Sleepiness Score is 2 out of 24. The patient rarely take naps. The patient complains of the following: needs new supplies and interested in [place] info which was given. Patient cannot afford replacement.  The compliance download is unavailable due to age of machine and need for replacement. Will re-verify with insurance what cost is to patient.  The patient does not complain of limb movements disrupting sleep. Continues to take 281m Nuvigil qAM and does well with this. Requesting refills.  ROS  General: (-) fever, (-) chills, (-) night sweat Nose and Sinuses: (-) nasal stuffiness or itchiness, (-) postnasal drip, (-) nosebleeds, (-) sinus trouble. Mouth and Throat: (-) sore throat, (-) hoarseness. Neck: (-) swollen glands, (-) enlarged thyroid, (-) neck pain. Respiratory: - cough, - shortness of breath, - wheezing. Neurologic: - numbness, - tingling. Psychiatric: - anxiety, - depression   Current Medication: Outpatient Encounter Medications as of 09/30/2020  Medication Sig   amitriptyline (ELAVIL) 25 MG tablet Take by mouth.   Armodafinil (NUVIGIL) 200 MG TABS Take 1 tablet by mouth daily.   azaTHIOprine (IMURAN) 50 MG tablet Take by mouth.   azithromycin (ZITHROMAX Z-PAK) 250 MG tablet Take 2 tablets (500 mg) on  Day 1,  followed by 1 tablet (250 mg) once daily on Days 2 through 5.   cyclobenzaprine (FLEXERIL) 10 MG tablet Take 1 tablet (10 mg total) by mouth 3 (three)  times daily as needed for muscle spasms.   ibuprofen (ADVIL,MOTRIN) 600 MG tablet Take 1 tablet (600 mg total) by mouth every 6 (six) hours as needed.   lisinopril (ZESTRIL) 10 MG tablet Take 10 mg by mouth daily.   sulfaSALAzine (AZULFIDINE) 500 MG tablet Take by mouth.   [DISCONTINUED] Armodafinil (NUVIGIL) 200 MG TABS Take 1 tablet by mouth daily.   No facility-administered encounter medications on file as of 09/30/2020.    Surgical History: Past Surgical History:  Procedure Laterality Date   hemmorrhoid     NASAL SEPTUM SURGERY     PROSTATE SURGERY      Medical History: Past Medical History:  Diagnosis Date   [place]    OSA on CPAP    Sleep apnea     Family History: Non contributory to the present illness  Social History: Social History   Socioeconomic History   Marital status: Married    Spouse name: Not on file   Number of children: Not on file   Years of education: Not on file   Highest education level: Not on file  Occupational History   Not on file  Tobacco Use   Smoking status: Never   Smokeless tobacco: Never  Substance and Sexual Activity   Alcohol use: Never   Drug use: Never   Sexual activity: Not on file  Other Topics Concern   Not on file  Social History Narrative   Not on file   Social Determinants of Health   Financial Resource Strain: Not on file  Food Insecurity: Not  on file  Transportation Needs: Not on file  Physical Activity: Not on file  Stress: Not on file  Social Connections: Not on file  Intimate Partner Violence: Not on file    Vital Signs: Blood pressure (!) 156/100, pulse (!) 110, resp. rate 18, height 6' 4"  (1.93 m), weight 236 lb (107 kg), SpO2 94 %.  Examination: General Appearance: The patient is well-developed, well-nourished, and in no distress. Neck Circumference: 40 Skin: Gross inspection of skin unremarkable. Head: normocephalic, no gross deformities. Eyes: no gross deformities noted. ENT: ears  appear grossly normal Neurologic: Alert and oriented. No involuntary movements.    EPWORTH SLEEPINESS SCALE:  Scale:  (0)= no chance of dozing; (1)= slight chance of dozing; (2)= moderate chance of dozing; (3)= high chance of dozing  Chance  Situtation    Sitting and reading: 0    Watching TV: 1    Sitting Inactive in public: 0    As a passenger in car: 1      Lying down to rest: 0    Sitting and talking: 0    Sitting quielty after lunch: 0    In a car, stopped in traffic: 0   TOTAL SCORE:   2 out of 24    SLEEP STUDIES:  Split  05/09 AHI 37 Spo59mn 70% Split 5/16 AHI 73 Spo267m 82%   CPAP COMPLIANCE DATA:  Download cannot be obtained         LABS: No results found for this or any previous visit (from the past 2160 hour(s)).  Radiology: DG Chest Portable 1 View  Result Date: 01/12/2019 CLINICAL DATA:  Cough. Additional provided: Cough beginning 01/04/2019. EXAM: PORTABLE CHEST 1 VIEW COMPARISON:  No pertinent prior studies available for comparison. FINDINGS: Heart size within normal limits. Mild ill-defined opacity at the right lung base may reflect atelectasis or early pneumonia. The left lung is clear. No evidence of pleural effusion or pneumothorax. No acute bony abnormality. IMPRESSION: Ill-defined opacity at the right lung base which may reflect atelectasis, but is suspicious for early pneumonia given provided history. Electronically Signed   By: KyKellie SimmeringO   On: 01/12/2019 17:59    No results found.  No results found.    Assessment and Plan: Patient Active Problem List   Diagnosis Date Noted   OSA on CPAP    Essential hypertension 02/18/2013   Anxiety 07/10/2010      The patient does tolerate PAP and reports benefit from PAP use. The patient was reminded how to adjust mask fit and advised to change supplies regularly. The patient was also counselled on nightly use. The machine cannot be downloaded and patient is due for replacement  however ha snot moved forward with this due to cost. Will re-verify cost and have billing discuss with patient further. Patient may also try to look into inspire but unsure of cost for this either and would likely need updated Psg if so.   1. OSA on CPAP Continue nightly use  2. CPAP use counseling CPAP couseling-Discussed importance of adequate CPAP use as well as proper care and cleaning techniques of machine and all supplies.  3. Hypersomnia May continue nuvigil qAM - Armodafinil (NUVIGIL) 200 MG TABS; Take 1 tablet by mouth daily.  Dispense: 90 tablet; Refill: 1  4. Essential hypertension Elevated in office, continue current medication and f/u with PCP.  5. Anxiety Stable, f/u with PCP    General Counseling: I have discussed the findings of the evaluation and examination with  Eulas Post.  I have also discussed any further diagnostic evaluation thatmay be needed or ordered today. Arnulfo verbalizes understanding of the findings of todays visit. We also reviewed his medications today and discussed drug interactions and side effects including but not limited excessive drowsiness and altered mental states. We also discussed that there is always a risk not just to him but also people around him. he has been encouraged to call the office with any questions or concerns that should arise related to todays visit.  No orders of the defined types were placed in this encounter.       I have personally obtained a history, examined the patient, evaluated laboratory and imaging results, formulated the assessment and plan and placed orders.  This patient was seen by Drema Dallas, PA-C in collaboration with Dr. Devona Konig as a part of collaborative care agreement.   Richelle Ito Saunders Glance, PhD, FAASM  Diplomate, American Board of Sleep Medicine    Allyne Gee, MD [facility] Diplomate ABMS Pulmonary and Critical Care Medicine Sleep medicine

## 2020-09-30 NOTE — Patient Instructions (Signed)

## 2020-10-01 ENCOUNTER — Encounter: Payer: Self-pay | Admitting: Internal Medicine

## 2021-03-30 NOTE — Progress Notes (Signed)
[facility] ?[address] ?[place], [place] [address] ? ?Pulmonary Sleep Medicine  ? ?Office Visit Note ? ?Patient Name: Gary Solis ?DOB: 08/10/1960 ?MRN [id number] ? ? ? ?Chief Complaint: Obstructive Sleep Apnea visit ? ?Brief History: ? ?Gary Solis is seen today for a follow up visit for CPAP@ 16 cmH2O. The patient has a 13 year history of sleep apnea. Patient is attempting to use PAP nightly.  The patient feels somewhat rested after sleeping with PAP.  The patient reports that he struggles nightly with using PAP use. Reported sleepiness is better and the Epworth Sleepiness Score is 4 out of 24. Patient believes his sleep medication helps his sleepiness overall. The patient does take naps on weekends for an hour. The patient complains of the following: struggles with use of the CPAP mask, very uncomfortable with it on his face.  He sometimes feels he is suffocating in the mask. Compliance is not available because his machine is old and doesn't report data.  The AHI is not available. The patient does not have limb movements disrupting sleep. Patient is currently scheduled for April 3rd for INSPIRE surgery. He has been taking nuvigil 255m qAM and is doing well with this. May be able to taper down or off after inspire if successful. Requests refills today. ? ?ROS ? ?General: (-) fever, (-) chills, (-) night sweat ?Nose and Sinuses: (-) nasal stuffiness or itchiness, (-) postnasal drip, (-) nosebleeds, (-) sinus trouble. ?Mouth and Throat: (-) sore throat, (-) hoarseness. ?Neck: (-) swollen glands, (-) enlarged thyroid, (-) neck pain. ?Respiratory: - cough, - shortness of breath, - wheezing. ?Neurologic: - numbness, - tingling. ?Psychiatric: - anxiety, - depression ? ? ?Current Medication: ?Outpatient Encounter Medications as of 03/31/2021  ?Medication Sig  ? amitriptyline (ELAVIL) 25 MG tablet Take by mouth.  ? Armodafinil (NUVIGIL) 200 MG TABS Take 1 tablet by mouth daily.  ? azithromycin (ZITHROMAX  Z-PAK) 250 MG tablet Take 2 tablets (500 mg) on  Day 1,  followed by 1 tablet (250 mg) once daily on Days 2 through 5.  ? cyclobenzaprine (FLEXERIL) 10 MG tablet Take 1 tablet (10 mg total) by mouth 3 (three) times daily as needed for muscle spasms.  ? ibuprofen (ADVIL,MOTRIN) 600 MG tablet Take 1 tablet (600 mg total) by mouth every 6 (six) hours as needed.  ? lisinopril (ZESTRIL) 10 MG tablet Take 10 mg by mouth daily.  ? [DISCONTINUED] Armodafinil (NUVIGIL) 200 MG TABS Take 1 tablet by mouth daily.  ? [DISCONTINUED] azaTHIOprine (IMURAN) 50 MG tablet Take by mouth.  ? [DISCONTINUED] sulfaSALAzine (AZULFIDINE) 500 MG tablet Take by mouth.  ? ?No facility-administered encounter medications on file as of 03/31/2021.  ? ? ?Surgical History: ?Past Surgical History:  ?Procedure Laterality Date  ? hemmorrhoid    ? NASAL SEPTUM SURGERY    ? PROSTATE SURGERY    ? ? ?Medical History: ?Past Medical History:  ?Diagnosis Date  ? [place] disease [place]   ? OSA on CPAP   ? Sleep apnea   ? ? ?Family History: ?Non contributory to the present illness ? ?Social History: ?Social History  ? ?Socioeconomic History  ? Marital status: Married  ?  Spouse name: Not on file  ? Number of children: Not on file  ? Years of education: Not on file  ? Highest education level: Not on file  ?Occupational History  ? Not on file  ?Tobacco Use  ? Smoking status: Never  ? Smokeless tobacco: Never  ?Substance and Sexual Activity  ?  Alcohol use: Never  ? Drug use: Never  ? Sexual activity: Not on file  ?Other Topics Concern  ? Not on file  ?Social History Narrative  ? Not on file  ? ?Social Determinants of Health  ? ?Financial Resource Strain: Not on file  ?Food Insecurity: Not on file  ?Transportation Needs: Not on file  ?Physical Activity: Not on file  ?Stress: Not on file  ?Social Connections: Not on file  ?Intimate Partner Violence: Not on file  ? ? ?Vital Signs: ?Blood pressure (!) 145/92, pulse (!) 102, resp. rate 16, height 6' 4"  (1.93 m), weight 226  lb 6.4 oz (102.7 kg), SpO2 96 %. ?Body mass index is 27.56 kg/m?.  ? ? ?Examination: ?General Appearance: The patient is well-developed, well-nourished, and in no distress. ?Neck Circumference: 40 cm ?Skin: Gross inspection of skin unremarkable. ?Head: normocephalic, no gross deformities. ?Eyes: no gross deformities noted. ?ENT: ears appear grossly normal ?Neurologic: Alert and oriented. No involuntary movements. ? ? ? ?EPWORTH SLEEPINESS SCALE: ? ?Scale:  ?(0)= no chance of dozing; (1)= slight chance of dozing; (2)= moderate chance of dozing; (3)= high chance of dozing ? ?Chance  Situtation ?   ?Sitting and reading: 1 ?  ? Watching TV: 1 ?   ?Sitting Inactive in public: 0 ?   ?As a passenger in car: 1   ?   ?Lying down to rest: 1 ?   ?Sitting and talking: 0 ?   ?Sitting quielty after lunch: 0 ?   ?In a car, stopped in traffic: 0 ? ? ?TOTAL SCORE:   4 out of 24 ? ? ? ?SLEEP STUDIES: ? ?Split Study (05/2007) AHI 37/hr, REM AHI 6/hr, Supine AHI 30/hr, min SpO2 70%, CPAP @ 7 cmH2O ?Split Study (05/2014) AHI 73/hr, min SpO2 82%, CPAP@ 12 cmH2O ? ? ?CPAP COMPLIANCE ? ?Not available ? ? ? ? ? ? ? ? ? ? ? ? ? ? ?LABS: ?No results found for this or any previous visit (from the past 2160 hour(s)). ? ?Radiology: ?DG Chest Portable 1 View ? ?Result Date: 01/12/2019 ?CLINICAL DATA:  Cough. Additional provided: Cough beginning 01/04/2019. EXAM: PORTABLE CHEST 1 VIEW COMPARISON:  No pertinent prior studies available for comparison. FINDINGS: Heart size within normal limits. Mild ill-defined opacity at the right lung base may reflect atelectasis or early pneumonia. The left lung is clear. No evidence of pleural effusion or pneumothorax. No acute bony abnormality. IMPRESSION: Ill-defined opacity at the right lung base which may reflect atelectasis, but is suspicious for early pneumonia given provided history. Electronically Signed   By: Kellie Simmering DO   On: 01/12/2019 17:59  ? ? ?No results found. ? ?No results  found. ? ? ? ?Assessment and Plan: ?Patient Active Problem List  ? Diagnosis Date Noted  ? OSA on CPAP   ? Essential hypertension 02/18/2013  ? Anxiety 07/10/2010  ? ? ? ? ?The patient somewhat tolerate PAP and reports some benefit from PAP use. The patient was reminded how to adjust mask fit and advised to change supplies regularly. No compliance is available and patient has been struggling with cpap therefore will be following with ENT for planned Inspire procedure. Discussed possibility of discontinuing Nuvigil after if daytime sleepiness improves. ? ? ?1. OSA on CPAP ?Continue cpap nightly until inspire procedure ? ?2. CPAP use counseling ?CPAP couseling-Discussed importance of adequate CPAP use as well as proper care and cleaning techniques of machine and all supplies. ? ?3. Hypersomnia ?May continue nuvigil qAM, may  need to re-evaluate to taper dose/stop after inspire procedure as this may help to resolve symptoms. ?- Armodafinil (NUVIGIL) 200 MG TABS; Take 1 tablet by mouth daily.  Dispense: 90 tablet; Refill: 1 ? ?4. Essential hypertension ?Continue current medication and f/u with PCP. ? ?5. Anxiety ?Continue current medication and f/u with PCP. ? ? ? ?General Counseling: I have discussed the findings of the evaluation and examination with [facility].  I have also discussed any further diagnostic evaluation thatmay be needed or ordered today. Gladstone verbalizes understanding of the findings of todays visit. We also reviewed his medications today and discussed drug interactions and side effects including but not limited excessive drowsiness and altered mental states. We also discussed that there is always a risk not just to him but also people around him. he has been encouraged to call the office with any questions or concerns that should arise related to todays visit. ? ?No orders of the defined types were placed in this encounter. ?  ? ? ? ? ?I have personally obtained a history, examined the patient, evaluated  laboratory and imaging results, formulated the assessment and plan and placed orders. ? ?This patient was seen by Drema Dallas, PA-C in collaboration with Dr. Devona Konig as a part of collaborative care agreement. ? ?Marlene Lard

## 2021-03-31 ENCOUNTER — Ambulatory Visit (INDEPENDENT_AMBULATORY_CARE_PROVIDER_SITE_OTHER): Payer: BC Managed Care – PPO | Admitting: Internal Medicine

## 2021-03-31 VITALS — BP 145/92 | HR 102 | Resp 16 | Ht 76.0 in | Wt 226.4 lb

## 2021-03-31 DIAGNOSIS — I10 Essential (primary) hypertension: Secondary | ICD-10-CM

## 2021-03-31 DIAGNOSIS — Z7189 Other specified counseling: Secondary | ICD-10-CM | POA: Diagnosis not present

## 2021-03-31 DIAGNOSIS — G4733 Obstructive sleep apnea (adult) (pediatric): Secondary | ICD-10-CM

## 2021-03-31 DIAGNOSIS — G4710 Hypersomnia, unspecified: Secondary | ICD-10-CM

## 2021-03-31 DIAGNOSIS — F419 Anxiety disorder, unspecified: Secondary | ICD-10-CM

## 2021-03-31 DIAGNOSIS — Z9989 Dependence on other enabling machines and devices: Secondary | ICD-10-CM

## 2021-03-31 MED ORDER — ARMODAFINIL 200 MG PO TABS: 1.0000 | ORAL_TABLET | Freq: Every day | ORAL | 1 refills | Status: DC

## 2021-03-31 NOTE — Patient Instructions (Signed)

## 2021-09-15 ENCOUNTER — Ambulatory Visit: Payer: BC Managed Care – PPO | Admitting: Internal Medicine

## 2021-09-15 NOTE — Progress Notes (Signed)
error 

## 2021-12-06 NOTE — Progress Notes (Unsigned)
[facility] [place], [place] [address]  Pulmonary Sleep Medicine   Office Visit Note  Patient Name: Gary Solis DOB: 1960-07-26 MRN [id number]    Chief Complaint: Obstructive Sleep Apnea visit  Brief History:  Yoandri is seen today for a follow up visit for on APAP 4-20 cmH2O. The patient has a 13 year history of sleep apnea. Patient is  using PAP nightly but typically only 3 hours. He has been pursuing INSPIRE surgery, plans with that are on hold at this time as his recent HST showed mild apnea which is below procedure threshold. He has a PSG scheduled in Feb at [facility] to re-evaluate. Patient is hear to discuss renewal of his Nuvigal 261m RX. He continues to do well on this and had hoped to be able to taper off once apnea truly controlled and sleeping well with Inspire. Since he is going over to [facility] now he may have prescription taken over by their provider, but [place]. The patient feels rested after sleeping with PAP.  The patient reports some benefit from PAP use. Reported sleepiness is the same and the Epworth Sleepiness Score is 4 out of 24. The patient does not usually take naps. The patient complains of the following: has changed to nasal pillows, has trouble tolerating a mask on his face. Oral dryness is also an issue. Patient denies oral venting. The compliance download shows 39% compliance with an average use time of 3.5 hours. The AHI is 7.2  The patient does not complain of limb movements disrupting sleep. The patient continues to require PAP therapy in order to eliminate sleep apnea.  ROS  General: (-) fever, (-) chills, (-) night sweat Nose and Sinuses: (-) nasal stuffiness or itchiness, (-) postnasal drip, (-) nosebleeds, (-) sinus trouble. Mouth and Throat: (-) sore throat, (-) hoarseness. Neck: (-) swollen glands, (-) enlarged thyroid, (-) neck pain. Respiratory: - cough, - shortness of breath, -  wheezing. Neurologic: - numbness, - tingling. Psychiatric: - anxiety, - depression   Current Medication: Outpatient Encounter Medications as of 12/07/2021  Medication Sig   amitriptyline (ELAVIL) 25 MG tablet Take by mouth.   Armodafinil (NUVIGIL) 200 MG TABS Take 1 tablet by mouth daily.   azithromycin (ZITHROMAX Z-PAK) 250 MG tablet Take 2 tablets (500 mg) on  Day 1,  followed by 1 tablet (250 mg) once daily on Days 2 through 5.   cyclobenzaprine (FLEXERIL) 10 MG tablet Take 1 tablet (10 mg total) by mouth 3 (three) times daily as needed for muscle spasms.   ibuprofen (ADVIL,MOTRIN) 600 MG tablet Take 1 tablet (600 mg total) by mouth every 6 (six) hours as needed.   lisinopril (ZESTRIL) 20 MG tablet Take 20 mg by mouth daily.   [DISCONTINUED] Armodafinil (NUVIGIL) 200 MG TABS Take 1 tablet by mouth daily.   [DISCONTINUED] lisinopril (ZESTRIL) 10 MG tablet Take 10 mg by mouth daily.   No facility-administered encounter medications on file as of 12/07/2021.    Surgical History: Past Surgical History:  Procedure Laterality Date   hemmorrhoid     NASAL SEPTUM SURGERY     PROSTATE SURGERY      Medical History: Past Medical History:  Diagnosis Date   Crohn disease ([place]    OSA on CPAP    Sleep apnea     Family History: Non contributory to the present illness  Social History: Social History   Socioeconomic History   Marital status: Married  Spouse name: Not on file   Number of children: Not on file   Years of education: Not on file   Highest education level: Not on file  Occupational History   Not on file  Tobacco Use   Smoking status: Never   Smokeless tobacco: Never  Substance and Sexual Activity   Alcohol use: Never   Drug use: Never   Sexual activity: Not on file  Other Topics Concern   Not on file  Social History Narrative   Not on file   Social Determinants of Health   Financial Resource Strain: Not on file  Food Insecurity: Not on file   Transportation Needs: Not on file  Physical Activity: Not on file  Stress: Not on file  Social Connections: Not on file  Intimate Partner Violence: Not on file    Vital Signs: Blood pressure 136/89, pulse (!) 102, resp. rate 12, height 6' 4"  (1.93 m), weight 234 lb 8 oz (106.4 kg), SpO2 94 %. Body mass index is 28.54 kg/m.    Examination: General Appearance: The patient is well-developed, well-nourished, and in no distress. Neck Circumference: 40 cm Skin: Gross inspection of skin unremarkable. Head: normocephalic, no gross deformities. Eyes: no gross deformities noted. ENT: ears appear grossly normal Neurologic: Alert and oriented. No involuntary movements.  STOP BANG RISK ASSESSMENT S (snore) Have you been told that you snore?     No   T (tired) Are you often tired, fatigued, or sleepy during the day?   NO  O (obstruction) Do you stop breathing, choke, or gasp during sleep? NO   P (pressure) Do you have or are you being treated for high blood pressure? YES   B (BMI) Is your body index greater than 35 kg/m? NO   A (age) Are you 59 years old or older? YES   N (neck) Do you have a neck circumference greater than 16 inches?   NO   G (gender) Are you a male? YES   TOTAL STOP/BANG "YES" ANSWERS 4       A STOP-Bang score of 2 or less is considered low risk, and a score of 5 or more is high risk for having either moderate or severe OSA. For people who score 3 or 4, doctors may need to perform further assessment to determine how likely they are to have OSA.         EPWORTH SLEEPINESS SCALE:  Scale:  (0)= no chance of dozing; (1)= slight chance of dozing; (2)= moderate chance of dozing; (3)= high chance of dozing  Chance  Situtation    Sitting and reading: 1    Watching TV: 1    Sitting Inactive in public: 0    As a passenger in car: 1      Lying down to rest: 1    Sitting and talking: 0    Sitting quielty after lunch: 0    In a car, stopped in traffic:  0   TOTAL SCORE:   4 out of 24    SLEEP STUDIES:  Split Study (05/2007) AHI 37/hr, REM AHI 6/hr, Supine AHI 30/hr, min SPO2 70%, CPAP@ 7 cmH2O Split Study (05/2014) AHI 73/hr, min SPO2 82%, CPAP@ 12 cmH2O   CPAP COMPLIANCE DATA:  Date Range: 09/08/21-12/06/21  Average Daily Use: 3 hours 45 min  Median Use: 3 hrs 18 min  Compliance for > 4 Hours: 39% days  AHI: 7.2 respiratory events per hour  Days Used: 83/90  Mask Leak: 35.7  95th Percentile Pressure: 10.1         LABS: No results found for this or any previous visit (from the past 2160 hour(s)).  Radiology: DG Chest Portable 1 View  Result Date: 01/12/2019 CLINICAL DATA:  Cough. Additional provided: Cough beginning 01/04/2019. EXAM: PORTABLE CHEST 1 VIEW COMPARISON:  No pertinent prior studies available for comparison. FINDINGS: Heart size within normal limits. Mild ill-defined opacity at the right lung base may reflect atelectasis or early pneumonia. The left lung is clear. No evidence of pleural effusion or pneumothorax. No acute bony abnormality. IMPRESSION: Ill-defined opacity at the right lung base which may reflect atelectasis, but is suspicious for early pneumonia given provided history. Electronically Signed   By: Kellie Simmering DO   On: 01/12/2019 17:59    No results found.  No results found.    Assessment and Plan: Patient Active Problem List   Diagnosis Date Noted   OSA on CPAP    Essential hypertension 02/18/2013   Anxiety 07/10/2010      The patient does not tolerate PAP and reports some benefit from PAP use. The patient was reminded how to adjust mask fit and advised to change supplies regularly. The patient was also counselled on nightly use. The compliance is poor. The AHI is 7.2 and may be mouth venting and due to mask discomfort and movement. Will narrow APAP range to 5-12cm H2O and remove EPR to help lower this and improve comfort as well. Pt continues to require cpap to treat his osa  and is medically necessary. Patient may still pursue inspire due to poor cpap tolerance and may be able to taper off Nuvigil in that case once sleeping better.  1. OSA on CPAP Continue cpap nightly until inspire procedure  2. CPAP use counseling CPAP couseling-Discussed importance of adequate CPAP use as well as proper care and cleaning techniques of machine and all supplies.  3. Hypersomnia May continue nuvigil qAM, will re-evaluate need and taper/stop after inspire.  - Armodafinil (NUVIGIL) 200 MG TABS; Take 1 tablet by mouth daily.  Dispense: 90 tablet; Refill: 1  4. Essential hypertension Continue current medication and f/u with PCP.    General Counseling: I have discussed the findings of the evaluation and examination with [facility].  I have also discussed any further diagnostic evaluation thatmay be needed or ordered today. Paulmichael verbalizes understanding of the findings of todays visit. We also reviewed his medications today and discussed drug interactions and side effects including but not limited excessive drowsiness and altered mental states. We also discussed that there is always a risk not just to him but also people around him. he has been encouraged to call the office with any questions or concerns that should arise related to todays visit.  No orders of the defined types were placed in this encounter.       I have personally obtained a history, examined the patient, evaluated laboratory and imaging results, formulated the assessment and plan and placed orders.  This patient was seen by Drema Dallas, PA-C in collaboration with Dr. Devona Konig as a part of collaborative care agreement.  Allyne Gee, MD [facility] Diplomate ABMS Pulmonary Critical Care Medicine and Sleep Medicine

## 2021-12-07 ENCOUNTER — Ambulatory Visit (INDEPENDENT_AMBULATORY_CARE_PROVIDER_SITE_OTHER): Payer: BC Managed Care – PPO | Admitting: Internal Medicine

## 2021-12-07 VITALS — BP 136/89 | HR 102 | Resp 12 | Ht 76.0 in | Wt 234.5 lb

## 2021-12-07 DIAGNOSIS — G4710 Hypersomnia, unspecified: Secondary | ICD-10-CM | POA: Diagnosis not present

## 2021-12-07 DIAGNOSIS — I10 Essential (primary) hypertension: Secondary | ICD-10-CM

## 2021-12-07 DIAGNOSIS — G4733 Obstructive sleep apnea (adult) (pediatric): Secondary | ICD-10-CM | POA: Diagnosis not present

## 2021-12-07 DIAGNOSIS — Z7189 Other specified counseling: Secondary | ICD-10-CM | POA: Diagnosis not present

## 2021-12-07 MED ORDER — ARMODAFINIL 200 MG PO TABS: 1.0000 | ORAL_TABLET | Freq: Every day | ORAL | 1 refills | Status: DC

## 2021-12-07 NOTE — Patient Instructions (Signed)

## 2022-06-03 NOTE — Progress Notes (Signed)
[facility] [address] [place], [place] [address]  Pulmonary Sleep Medicine   Office Visit Note  Patient Name: Gary Solis DOB: 08-20-1960 MRN [id number]    Chief Complaint: Obstructive Sleep Apnea visit  Brief History:  Gary Solis is seen today for a follow up visit for APAP@ 4-12 cmH2O. The patient has a 14 year history of sleep apnea. Patient is using PAP nightly.  The patient feels rested after sleeping with PAP.  The patient reports benefiting from PAP use. Reported sleepiness is improved and the Epworth Sleepiness Score is 3 out of 24. The patient does not take naps. The patient complains of the following: none.  The compliance download shows 80% compliance with an average use time of 5 hours 22 minutes. The AHI is 3.7.  The patient does not complain of limb movements disrupting sleep. The patient continues to require PAP therapy in order to eliminate sleep apnea. He previously had a updated HST which showed mild Osa which didn't qualify for Inspire and had planned to retest in lab with PSG since he previously had more mod-severe OSA and had been pursing Inspire procedure. The patient reports his PSG at [facility] was denied by insurance. In the mean time he requires a compliant download for DOT and had this extended to July and has been really trying to use more and is now compliant and controlled. He also continues to take Nuvigil for his hypersomnia and denies side effects.  ROS  General: (-) fever, (-) chills, (-) night sweat Nose and Sinuses: (-) nasal stuffiness or itchiness, (-) postnasal drip, (-) nosebleeds, (-) sinus trouble. Mouth and Throat: (-) sore throat, (-) hoarseness. Neck: (-) swollen glands, (-) enlarged thyroid, (-) neck pain. Respiratory: - cough, - shortness of breath, - wheezing. Neurologic: - numbness, - tingling. Psychiatric: - anxiety, - depression   Current Medication: Outpatient Encounter Medications as of 06/07/2022  Medication Sig    pregabalin (LYRICA) 75 MG capsule Take by mouth.   amitriptyline (ELAVIL) 25 MG tablet Take by mouth.   Armodafinil (NUVIGIL) 200 MG TABS Take 1 tablet (200 mg total) by mouth daily.   azithromycin (ZITHROMAX Z-PAK) 250 MG tablet Take 2 tablets (500 mg) on  Day 1,  followed by 1 tablet (250 mg) once daily on Days 2 through 5.   cyclobenzaprine (FLEXERIL) 10 MG tablet Take 1 tablet (10 mg total) by mouth 3 (three) times daily as needed for muscle spasms.   ibuprofen (ADVIL,MOTRIN) 600 MG tablet Take 1 tablet (600 mg total) by mouth every 6 (six) hours as needed.   lisinopril (ZESTRIL) 20 MG tablet Take 20 mg by mouth daily.   [DISCONTINUED] Armodafinil (NUVIGIL) 200 MG TABS Take 1 tablet by mouth daily.   No facility-administered encounter medications on file as of 06/07/2022.    Surgical History: Past Surgical History:  Procedure Laterality Date   hemmorrhoid     NASAL SEPTUM SURGERY     PROSTATE SURGERY      Medical History: Past Medical History:  Diagnosis Date   Crohn disease (HCC)    OSA on CPAP    Sleep apnea     Family History: Non contributory to the present illness  Social History: Social History   Socioeconomic History   Marital status: Married    Spouse name: Not on file   Number of children: Not on file   Years of education: Not on file   Highest education level: Not on file  Occupational History   Not on  file  Tobacco Use   Smoking status: Never   Smokeless tobacco: Never  Substance and Sexual Activity   Alcohol use: Never   Drug use: Never   Sexual activity: Not on file  Other Topics Concern   Not on file  Social History Narrative   Not on file   Social Determinants of Health   Financial Resource Strain: Not on file  Food Insecurity: Not on file  Transportation Needs: Not on file  Physical Activity: Not on file  Stress: Not on file  Social Connections: Not on file  Intimate Partner Violence: Not on file    Vital Signs: Blood pressure (!)  154/91, pulse (!) 115, resp. rate 18, height 6\' 4"  (1.93 m), weight 234 lb (106.1 kg), SpO2 96 %. Body mass index is 28.48 kg/m.    Examination: General Appearance: The patient is well-developed, well-nourished, and in no distress. Neck Circumference: 40 cm Skin: Gross inspection of skin unremarkable. Head: normocephalic, no gross deformities. Eyes: no gross deformities noted. ENT: ears appear grossly normal Neurologic: Alert and oriented. No involuntary movements.  STOP BANG RISK ASSESSMENT S (snore) Have you been told that you snore?     NO   T (tired) Are you often tired, fatigued, or sleepy during the day?   NO  O (obstruction) Do you stop breathing, choke, or gasp during sleep? NO   P (pressure) Do you have or are you being treated for high blood pressure? YES   B (BMI) Is your body index greater than 35 kg/m? NO   A (age) Are you 3 years old or older? YES   N (neck) Do you have a neck circumference greater than 16 inches?   NO   G (gender) Are you a male? YES   TOTAL STOP/BANG "YES" ANSWERS 3       A STOP-Bang score of 2 or less is considered low risk, and a score of 5 or more is high risk for having either moderate or severe OSA. For people who score 3 or 4, doctors may need to perform further assessment to determine how likely they are to have OSA.         EPWORTH SLEEPINESS SCALE:  Scale:  (0)= no chance of dozing; (1)= slight chance of dozing; (2)= moderate chance of dozing; (3)= high chance of dozing  Chance  Situtation    Sitting and reading: 0    Watching TV: 1    Sitting Inactive in public: 0    As a passenger in car: 1      Lying down to rest: 1    Sitting and talking: 0    Sitting quielty after lunch: 0    In a car, stopped in traffic: 0   TOTAL SCORE:   3 out of 24    SLEEP STUDIES:  Split Study (05/2007 at [facility]) AHI 37/hr, REM AHI 6/hr, Supine AHI 90/hr, min SpO2 70%, CPAP@ 7 cmH2O Split Study (05/2014 at [facility]) AHI 25/hr, Supine AHI  73/hr, min SpO2 82%, CPAP@ 12 cmH2O Titration (04/2015 at [facility]) CPAP@ 16 cmH2O MWT (03/2016 at [facility]) Normal MWT HST (12/2016) AHI 7.1/hr, min SpO2 77% MSLT/MWT (03/2017) Normal maintenance of wakefulness test w/ a normal sleep onset latency of 38:07 minutes w/ the use of Nuvigil prior to the study. HST (07/2021) AHI 9/hr, min Spo2 75%   CPAP COMPLIANCE DATA:  Date Range: 04/08/2022-06/06/2022  Average Daily Use: 5 hours 22 minutes  Median Use: 5 hours 18 minutes  Compliance  for > 4 Hours:80%  AHI: 3.7 respiratory events per hour  Days Used: 59/60 days  Mask Leak: 25.7  95th Percentile Pressure: 9.9         LABS: No results found for this or any previous visit (from the past 2160 hour(s)).  Radiology: DG Chest Portable 1 View  Result Date: 01/12/2019 CLINICAL DATA:  Cough. Additional provided: Cough beginning 01/04/2019. EXAM: PORTABLE CHEST 1 VIEW COMPARISON:  No pertinent prior studies available for comparison. FINDINGS: Heart size within normal limits. Mild ill-defined opacity at the right lung base may reflect atelectasis or early pneumonia. The left lung is clear. No evidence of pleural effusion or pneumothorax. No acute bony abnormality. IMPRESSION: Ill-defined opacity at the right lung base which may reflect atelectasis, but is suspicious for early pneumonia given provided history. Electronically Signed   By: Jackey Loge DO   On: 01/12/2019 17:59    No results found.  No results found.    Assessment and Plan: Patient Active Problem List   Diagnosis Date Noted   OSA on CPAP    Essential hypertension 02/18/2013   Anxiety 07/10/2010      The patient does tolerate PAP and reports benefit from PAP use. The patient was reminded how to adjust mask fit and advised to change supplies regularly. The patient was also counselled on nightly use. The compliance is good. The AHI is 3.7. Patient continues to require PAP to treat their apnea and is medically  necessary.   1. OSA on CPAP Continue nightly use  2. CPAP use counseling CPAP couseling-Discussed importance of adequate CPAP use as well as proper care and cleaning techniques of machine and all supplies.  3. Hypersomnia May continue nuvigil as before - Armodafinil (NUVIGIL) 200 MG TABS; Take 1 tablet (200 mg total) by mouth daily.  Dispense: 90 tablet; Refill: 1  4. Essential hypertension Continue current medication and f/u with PCP.    General Counseling: I have discussed the findings of the evaluation and examination with [facility].  I have also discussed any further diagnostic evaluation thatmay be needed or ordered today. Claris verbalizes understanding of the findings of todays visit. We also reviewed his medications today and discussed drug interactions and side effects including but not limited excessive drowsiness and altered mental states. We also discussed that there is always a risk not just to him but also people around him. he has been encouraged to call the office with any questions or concerns that should arise related to todays visit.  No orders of the defined types were placed in this encounter.       I have personally obtained a history, examined the patient, evaluated laboratory and imaging results, formulated the assessment and plan and placed orders.  This patient was seen by Lynn Ito, PA-C in collaboration with Dr. Freda Munro as a part of collaborative care agreement.  Yevonne Pax, MD [facility] Diplomate ABMS Pulmonary Critical Care Medicine and Sleep Medicine

## 2022-06-07 ENCOUNTER — Ambulatory Visit (INDEPENDENT_AMBULATORY_CARE_PROVIDER_SITE_OTHER): Payer: BC Managed Care – PPO | Admitting: Internal Medicine

## 2022-06-07 VITALS — BP 154/91 | HR 115 | Resp 18 | Ht 76.0 in | Wt 234.0 lb

## 2022-06-07 DIAGNOSIS — G4710 Hypersomnia, unspecified: Secondary | ICD-10-CM

## 2022-06-07 DIAGNOSIS — I10 Essential (primary) hypertension: Secondary | ICD-10-CM

## 2022-06-07 DIAGNOSIS — Z7189 Other specified counseling: Secondary | ICD-10-CM | POA: Diagnosis not present

## 2022-06-07 DIAGNOSIS — G4733 Obstructive sleep apnea (adult) (pediatric): Secondary | ICD-10-CM | POA: Diagnosis not present

## 2022-06-07 MED ORDER — ARMODAFINIL 200 MG PO TABS
1.0000 | ORAL_TABLET | Freq: Every day | ORAL | 1 refills | Status: DC
Start: 2022-06-07 — End: 2022-12-14

## 2022-06-07 NOTE — Patient Instructions (Signed)

## 2022-12-14 ENCOUNTER — Ambulatory Visit (INDEPENDENT_AMBULATORY_CARE_PROVIDER_SITE_OTHER): Payer: BC Managed Care – PPO | Admitting: Internal Medicine

## 2022-12-14 VITALS — BP 153/93 | HR 80 | Resp 16 | Ht 76.0 in | Wt 234.0 lb

## 2022-12-14 DIAGNOSIS — I10 Essential (primary) hypertension: Secondary | ICD-10-CM | POA: Diagnosis not present

## 2022-12-14 DIAGNOSIS — G4710 Hypersomnia, unspecified: Secondary | ICD-10-CM | POA: Diagnosis not present

## 2022-12-14 DIAGNOSIS — Z7189 Other specified counseling: Secondary | ICD-10-CM | POA: Diagnosis not present

## 2022-12-14 DIAGNOSIS — G4733 Obstructive sleep apnea (adult) (pediatric): Secondary | ICD-10-CM

## 2022-12-14 MED ORDER — ARMODAFINIL 200 MG PO TABS: 1.0000 | ORAL_TABLET | Freq: Every day | ORAL | 1 refills | Status: DC

## 2022-12-14 NOTE — Progress Notes (Signed)
[facility] [address] [place], [place] [address]  Pulmonary Sleep Medicine   Office Visit Note  Patient Name: Gary Solis DOB: 10-11-1960 MRN [id number]    Chief Complaint: Obstructive Sleep Apnea visit  Brief History:  Jamerson is seen today for a 6 month follow up visit for APAP@ 4-12 cmH2O. The patient has a 14 year history of sleep apnea. Patient is using PAP nightly.  The patient feels rested after sleeping with PAP.  The patient reports benefiting from PAP use. Reported sleepiness is  improved and the Epworth Sleepiness Score is 3 out of 24. The patient does take naps. The patient complains of the following: none.  The compliance download shows 83% compliance with an average use time of 5 hours 24 minutes. The AHI is 3.2.  The patient does not complain of limb movements disrupting sleep. The patient continues to require PAP therapy in order to eliminate sleep apnea. Continues to use armodafinil for daytime sleepiness and denies any side effects.  ROS  General: (-) fever, (-) chills, (-) night sweat Nose and Sinuses: (-) nasal stuffiness or itchiness, (-) postnasal drip, (-) nosebleeds, (-) sinus trouble. Mouth and Throat: (-) sore throat, (-) hoarseness. Neck: (-) swollen glands, (-) enlarged thyroid, (-) neck pain. Respiratory: - cough, - shortness of breath, - wheezing. Neurologic: - numbness, - tingling. Psychiatric: - anxiety, - depression   Current Medication: Outpatient Encounter Medications as of 12/14/2022  Medication Sig   metFORMIN (GLUCOPHAGE-XR) 500 MG 24 hr tablet Take by mouth.   amitriptyline (ELAVIL) 25 MG tablet Take by mouth.   Armodafinil (NUVIGIL) 200 MG TABS Take 1 tablet (200 mg total) by mouth daily.   azithromycin (ZITHROMAX Z-PAK) 250 MG tablet Take 2 tablets (500 mg) on  Day 1,  followed by 1 tablet (250 mg) once daily on Days 2 through 5.   cyclobenzaprine (FLEXERIL) 10 MG tablet Take 1 tablet (10 mg total) by mouth 3 (three)  times daily as needed for muscle spasms.   ibuprofen (ADVIL,MOTRIN) 600 MG tablet Take 1 tablet (600 mg total) by mouth every 6 (six) hours as needed.   lisinopril (ZESTRIL) 20 MG tablet Take 20 mg by mouth daily.   pregabalin (LYRICA) 75 MG capsule Take by mouth.   [DISCONTINUED] Armodafinil (NUVIGIL) 200 MG TABS Take 1 tablet (200 mg total) by mouth daily.   No facility-administered encounter medications on file as of 12/14/2022.    Surgical History: Past Surgical History:  Procedure Laterality Date   hemmorrhoid     NASAL SEPTUM SURGERY     PROSTATE SURGERY      Medical History: Past Medical History:  Diagnosis Date   Crohn disease (HCC)    OSA on CPAP    Sleep apnea     Family History: Non contributory to the present illness  Social History: Social History   Socioeconomic History   Marital status: Married    Spouse name: Not on file   Number of children: Not on file   Years of education: Not on file   Highest education level: Not on file  Occupational History   Not on file  Tobacco Use   Smoking status: Never   Smokeless tobacco: Never  Substance and Sexual Activity   Alcohol use: Never   Drug use: Never   Sexual activity: Not on file  Other Topics Concern   Not on file  Social History Narrative   Not on file   Social Determinants of Health   Financial  Resource Strain: Low Risk  (08/29/2022)   Received from [facility]   Overall Financial Resource Strain (CARDIA)    Difficulty of Paying Living Expenses: Not hard at all  Food Insecurity: No Food Insecurity (08/29/2022)   Received from [facility]   Hunger Vital Sign    Worried About Running Out of Food in the Last Year: Never true    Ran Out of Food in the Last Year: Never true  Transportation Needs: No Transportation Needs (08/29/2022)   Received from [facility] - Transportation    In the past 12 months, has lack of transportation kept  you from medical appointments or from getting medications?: No    Lack of Transportation (Non-Medical): No  Physical Activity: Patient Declined (05/31/2019)   Received from [facility], [facility]   Exercise Vital Sign    Days of Exercise per Week: Patient declined    Minutes of Exercise per Session: Patient declined  Stress: No Stress Concern Present (05/31/2019)   Received from [facility], [facility]   [employer] of Occupational Health - Occupational Stress Questionnaire    Feeling of Stress : Not at all  Social Connections: Moderately Isolated (05/31/2019)   Received from [facility], [facility]   Social Connection and Isolation Panel [NHANES]    Frequency of Communication with Friends and Family: Once a week    Frequency of Social Gatherings with Friends and Family: Once a week    Attends Religious Services: More than 4 times per year    Active Member of [employer] or Organizations: No    Attends Engineer, structural: Patient declined    Marital Status: Married  Catering manager Violence: Not on file    Vital Signs: Blood pressure (!) 153/93, pulse 80, resp. rate 16, height 6\' 4"  (1.93 m), weight 234 lb (106.1 kg), SpO2 96%. Body mass index is 28.48 kg/m.    Examination: General Appearance: The patient is well-developed, well-nourished, and in no distress. Neck Circumference: 40 cm Skin: Gross inspection of skin unremarkable. Head: normocephalic, no gross deformities. Eyes: no gross deformities noted. ENT: ears appear grossly normal Neurologic: Alert and oriented. No involuntary movements.  STOP BANG RISK ASSESSMENT S (snore) Have you been told that you snore?     NO   T (tired) Are you often tired, fatigued, or sleepy during the day?   NO  O (obstruction) Do you stop breathing, choke, or gasp during sleep? NO   P (pressure) Do you have or are you  being treated for high blood pressure? YES   B (BMI) Is your body index greater than 35 kg/m? NO   A (age) Are you 82 years old or older? YES   N (neck) Do you have a neck circumference greater than 16 inches?   NO   G (gender) Are you a male? YES   TOTAL STOP/BANG "YES" ANSWERS 3       A STOP-Bang score of 2 or less is considered low risk, and a score of 5 or more is high risk for having either moderate or severe OSA. For people who score 3 or 4, doctors may need to perform further assessment to determine how likely they are to have OSA.         EPWORTH SLEEPINESS SCALE:  Scale:  (0)= no chance of dozing; (1)= slight chance of dozing; (2)= moderate  chance of dozing; (3)= high chance of dozing  Chance  Situtation    Sitting and reading: 0    Watching TV: 1    Sitting Inactive in public: 0    As a passenger in car: 1      Lying down to rest: 1    Sitting and talking: 0    Sitting quielty after lunch: 0    In a car, stopped in traffic: 0   TOTAL SCORE:   3 out of 24    SLEEP STUDIES:  Split Study (05/2007) AHI 37/hr, REM AHI 6/hr, Supine AHI 30/hr, min SpO2 70%, CPAP@ 7 cmH2O Split Study (05/2014) AHI 73/hr, min SpO2 82%, CPAP@ 12 cmH2O Titration (04/2015 at [facility]) CPAP@ 16 cmH2O MWT (03/2016 at [facility]) Normal MWT HST (12/2016) AHI 7.1/hr, min SpO2 77% MSLT/MWT (03/2017) Normal maintenance of wakefulness test w/ a normal sleep onset latency of 38:07 minutes w/ the use of Nuvigil prior to the study. HST (07/2021) AHI 9/hr, min SpO2 75%   CPAP COMPLIANCE DATA:  Date Range: 04/13/2022- 07/11/2022   Average Daily Use: 5 hours 24 minutes  Median Use: 5 hours 17 minutes  Compliance for > 4 Hours: 83%  AHI: 3.2 respiratory events per hour  Days Used: 89/90 days  Mask Leak: 23.5  95th Percentile Pressure: 9.9         LABS: No results found for this or any previous visit (from the past 2160 hour(s)).  Radiology: DG Chest Portable 1 View  Result  Date: 01/12/2019 CLINICAL DATA:  Cough. Additional provided: Cough beginning 01/04/2019. EXAM: PORTABLE CHEST 1 VIEW COMPARISON:  No pertinent prior studies available for comparison. FINDINGS: Heart size within normal limits. Mild ill-defined opacity at the right lung base may reflect atelectasis or early pneumonia. The left lung is clear. No evidence of pleural effusion or pneumothorax. No acute bony abnormality. IMPRESSION: Ill-defined opacity at the right lung base which may reflect atelectasis, but is suspicious for early pneumonia given provided history. Electronically Signed   By: Jackey Loge DO   On: 01/12/2019 17:59    No results found.  No results found.    Assessment and Plan: Patient Active Problem List   Diagnosis Date Noted   OSA on CPAP    Essential hypertension 02/18/2013   Anxiety 07/10/2010      The patient does tolerate PAP and reports benefit from PAP use. The patient was reminded how to adjust mask fit and advised to change supplies regularly. The patient was also counselled on nightly use. The compliance is good. The AHI is 3.2. Patient continues to require PAP to treat their apnea and is medically necessary.  1. OSA on CPAP Continue nightly use  2. CPAP use counseling CPAP couseling-Discussed importance of adequate CPAP use as well as proper care and cleaning techniques of machine and all supplies.  3. Hypersomnia May continue armodafinil as before, refills given - Armodafinil (NUVIGIL) 200 MG TABS; Take 1 tablet (200 mg total) by mouth daily.  Dispense: 90 tablet; Refill: 1 [place] Controlled Substance Database was reviewed by me for overdose risk score (ORS)  4. Essential hypertension Per pt BP always higher in office, pt will continue current medication and follow up with PCP   General Counseling: I have discussed the findings of the evaluation and examination with [facility].  I have also discussed any further diagnostic evaluation thatmay be needed or ordered  today. Nizar verbalizes understanding of the findings of todays visit. We also reviewed his medications  today and discussed drug interactions and side effects including but not limited excessive drowsiness and altered mental states. We also discussed that there is always a risk not just to him but also people around him. he has been encouraged to call the office with any questions or concerns that should arise related to todays visit.  No orders of the defined types were placed in this encounter.       I have personally obtained a history, examined the patient, evaluated laboratory and imaging results, formulated the assessment and plan and placed orders.  This patient was seen by Lynn Ito, PA-C in collaboration with Dr. Freda Munro as a part of collaborative care agreement.  Yevonne Pax, MD [facility] Diplomate ABMS Pulmonary Critical Care Medicine and Sleep Medicine

## 2022-12-14 NOTE — Patient Instructions (Signed)

## 2023-06-20 NOTE — Progress Notes (Signed)
 [facility] [address] [place], [place] [address]  Pulmonary Sleep Medicine   Office Visit Note  Patient Name: Gary Solis DOB: 17-Feb-1960 MRN [id number]    Chief Complaint: Obstructive Sleep Apnea visit  Brief History:  Gary Solis is seen today for 6 month follow up visit for APAP@ 4-12 cmH2O. The patient has a 16 year history of sleep apnea. Patient is using PAP nightly.  The patient feels rested after sleeping with PAP.  The patient reports benefiting from PAP use. Reported sleepiness is  improved and the Epworth Sleepiness Score is 3 out of 24. The patient will occasionally take naps. The patient complains of the following: none.  The compliance download shows 78% compliance with an average use time of 5 hours 11 minutes. The AHI is 3.9.  The patient does not complain of limb movements disrupting sleep. Patient continues to require PAP therapy in order to eliminate sleep apnea. Doing well with nuvigil  and finds this beneficial. No side effects. He is aware he cannot take this if BP/HR remain elevated. 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 He will monitor closely and ensure it is back to normal. Denies symptoms.  ROS  General: (-) fever, (-) chills, (-) night sweat Nose and Sinuses: (-) nasal stuffiness or itchiness, (-) postnasal drip, (-) nosebleeds, (-) sinus trouble. Mouth and Throat: (-) sore throat, (-) hoarseness. Neck: (-) swollen glands, (-) enlarged thyroid, (-) neck pain. Respiratory: - cough, - shortness of breath, - wheezing. Neurologic: - numbness, - tingling. Psychiatric: - anxiety, - depression   Current Medication: Outpatient Encounter Medications as of 06/21/2023  Medication Sig   amitriptyline (ELAVIL) 25 MG tablet Take by mouth.   Armodafinil  (NUVIGIL ) 200 MG TABS Take 1 tablet (200 mg total) by mouth daily.   azithromycin  (ZITHROMAX  Z-PAK) 250 MG tablet Take 2  tablets (500 mg) on  Day 1,  followed by 1 tablet (250 mg) once daily on Days 2 through 5.   cyclobenzaprine  (FLEXERIL ) 10 MG tablet Take 1 tablet (10 mg total) by mouth 3 (three) times daily as needed for muscle spasms.   ibuprofen  (ADVIL ,MOTRIN ) 600 MG tablet Take 1 tablet (600 mg total) by mouth every 6 (six) hours as needed.   lisinopril (ZESTRIL) 20 MG tablet Take 20 mg by mouth daily.   metFORMIN (GLUCOPHAGE-XR) 500 MG 24 hr tablet Take by mouth.   pregabalin (LYRICA) 75 MG capsule Take by mouth.   [DISCONTINUED] Armodafinil  (NUVIGIL ) 200 MG TABS Take 1 tablet (200 mg total) by mouth daily.   No facility-administered encounter medications on file as of 06/21/2023.    Surgical History: Past Surgical History:  Procedure Laterality Date   hemmorrhoid     NASAL SEPTUM SURGERY     PROSTATE SURGERY      Medical History: Past Medical History:  Diagnosis Date   Crohn disease (HCC)    OSA on CPAP    Sleep apnea     Family History: Non contributory to the present illness  Social History: Social History   Socioeconomic History   Marital status: Married    Spouse name: Not on file   Number of children: Not on file   Years of education: Not on file   Highest education level: Not on file  Occupational History   Not on file  Tobacco Use   Smoking status: Never   Smokeless tobacco: Never  Substance and Sexual Activity  Alcohol use: Never   Drug use: Never   Sexual activity: Not on file  Other Topics Concern   Not on file  Social History Narrative   Not on file   Social Drivers of Health   Financial Resource Strain: Low Risk  (08/29/2022)   Received from [facility]   Overall Financial Resource Strain (CARDIA)    Difficulty of Paying Living Expenses: Not hard at all  Food Insecurity: No Food Insecurity (08/29/2022)   Received from [facility]   Hunger Vital Sign    Worried About Running Out of Food in the Last Year: Never true     Ran Out of Food in the Last Year: Never true  Transportation Needs: No Transportation Needs (08/29/2022)   Received from [facility] - Transportation    In the past 12 months, has lack of transportation kept you from medical appointments or from getting medications?: No    Lack of Transportation (Non-Medical): No  Physical Activity: Patient Declined (05/31/2019)   Received from [facility], [facility]   Exercise Vital Sign    Days of Exercise per Week: Patient declined    Minutes of Exercise per Session: Patient declined  Stress: No Stress Concern Present (05/31/2019)   Received from [facility], [facility]   [employer] of Occupational Health - Occupational Stress Questionnaire    Feeling of Stress : Not at all  Social Connections: Moderately Isolated (05/31/2019)   Received from [facility], [facility]   Social Connection and Isolation Panel [NHANES]    Frequency of Communication with Friends and Family: Once a week    Frequency of Social Gatherings with Friends and Family: Once a week    Attends Religious Services: More than 4 times per year    Active Member of [employer] or Organizations: No    Attends Engineer, structural: Patient declined    Marital Status: Married  Catering manager Violence: Not on file    Vital Signs: Blood pressure (!) 175/100, pulse (!) 110, resp. rate 16, height 6' 4 (1.93 m), weight 234 lb (106.1 kg), SpO2 96%. Body mass index is 28.48 kg/m.    Examination: General Appearance: The patient is well-developed, well-nourished, and in no distress. Neck Circumference: 41 cm Skin: Gross inspection of skin unremarkable. Head: normocephalic, no gross deformities. Eyes: no gross deformities noted. ENT: ears appear grossly normal Neurologic: Alert and oriented. No involuntary movements.  STOP BANG RISK  ASSESSMENT S (snore) Have you been told that you snore?     NO   T (tired) Are you often tired, fatigued, or sleepy during the day?   NO  O (obstruction) Do you stop breathing, choke, or gasp during sleep? NO   P (pressure) Do you have or are you being treated for high blood pressure? YES   B (BMI) Is your body index greater than 35 kg/m? NO   A (age) Are you 102 years old or older? YES   N (neck) Do you have a neck circumference greater than 16 inches?   NO   G (gender) Are you a male? YES   TOTAL STOP/BANG "YES" ANSWERS 3       A STOP-Bang score of 2 or less is considered low risk, and a score of 5 or more is high risk for having either moderate or severe OSA. For people who score 3 or  4, doctors may need to perform further assessment to determine how likely they are to have OSA.         EPWORTH SLEEPINESS SCALE:  Scale:  (0)= no chance of dozing; (1)= slight chance of dozing; (2)= moderate chance of dozing; (3)= high chance of dozing  Chance  Situtation    Sitting and reading: 0    Watching TV: 1    Sitting Inactive in public: 0    As a passenger in car: 1      Lying down to rest: 1    Sitting and talking: 0    Sitting quielty after lunch: 0    In a car, stopped in traffic: 0   TOTAL SCORE:   3 out of 24    SLEEP STUDIES:  Split - 05/2007 - AHI 37/hr, REM AHI 6/hr, Supine AHI 30/hr, min Sp02 70%, CPAP @ 7cmH20 Split - 05/2014 - AHI 73/hr, min Sp02 82%, CPAP @ 12cmH20 [place] - 04/2015 - CPAP @ 16cmH20 MWT - 03/2016 - Normal MWT HST - 12/2016 - AHI 7.1/hr, min Sp02 77% MSLT/MWT - 03/2017 - Normal maintenance of wakefulness test w/ a normal sleep onset latency of 38:07 minutes with the use of Nuvigil  prior to the study HST - 07/2021 - AHI 9/hr, min Sp02 75%   CPAP COMPLIANCE DATA:  Date Range: 02/21/2023-06/20/2023  Average Daily Use: 5 hours 11 minutes  Median Use: 5 hours 11 minutes  Compliance for > 4 Hours: 78%  AHI: 3.9 respiratory events per  hour  Days Used: 120/120 days  Mask Leak: 27.6  95th Percentile Pressure: 10.6         LABS: No results found for this or any previous visit (from the past 2160 hours).  Radiology: DG Chest Portable 1 View Result Date: 01/12/2019 CLINICAL DATA:  Cough. Additional provided: Cough beginning 01/04/2019. EXAM: PORTABLE CHEST 1 VIEW COMPARISON:  No pertinent prior studies available for comparison. FINDINGS: Heart size within normal limits. Mild ill-defined opacity at the right lung base may reflect atelectasis or early pneumonia. The left lung is clear. No evidence of pleural effusion or pneumothorax. No acute bony abnormality. IMPRESSION: Ill-defined opacity at the right lung base which may reflect atelectasis, but is suspicious for early pneumonia given provided history. Electronically Signed   By: Bascom Lily DO   On: 01/12/2019 17:59    No results found.  No results found.    Assessment and Plan: Patient Active Problem List   Diagnosis Date Noted   OSA on CPAP    Essential hypertension 02/18/2013   Anxiety 07/10/2010      The patient does tolerate PAP and reports benefit from PAP use. The patient was reminded how to adjust mask fit and advised to change supplies regularly. The patient was also counselled on nightly use. The compliance is fair. The AHI is 3.9. Patient continues to require PAP to treat their apnea and is medically necessary.  1. OSA on CPAP (Primary) Continue nightly use  2. CPAP use counseling CPAP couseling-Discussed importance of adequate CPAP use as well as proper care and cleaning techniques of machine and all supplies.  3. Hypersomnia May continue nuvigil  as before as long as BP/HR better controlled.  Refills given - Armodafinil  (NUVIGIL ) 200 MG TABS; Take 1 tablet (200 mg total) by mouth daily.  Dispense: 90 tablet; Refill: 1 [place] Controlled Substance Database was reviewed by me for overdose risk score (ORS)  4. 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 He reports it is normally well controlled and will recheck at home. Advised to follow up with PCP or seek higher care if not improving or if symptoms arise. Pt aware not to take nuvigil  if this is still elevated and is aware of possible S/E and risks of this medication.   General Counseling: I have discussed the findings of the evaluation and examination with [facility].  I have also discussed any further diagnostic evaluation thatmay be needed or ordered today. Kieffer verbalizes understanding of the findings of todays visit. We also reviewed his medications today and discussed drug interactions and side effects including but not limited excessive drowsiness and altered mental states. We also discussed that there is always a risk not just to him but also people around him. he has been encouraged to call the office with any questions or concerns that should arise related to todays visit.  No orders of the defined types were placed in this encounter.       I have personally obtained a history, examined the patient, evaluated laboratory and imaging results, formulated the assessment and plan and placed orders.  This patient was seen by Taylor Favia, PA-C in collaboration with Dr. Cam Cava as a part of collaborative care agreement.  Cordie Deters, MD [facility] Diplomate ABMS Pulmonary Critical Care Medicine and Sleep Medicine

## 2023-06-21 ENCOUNTER — Ambulatory Visit (INDEPENDENT_AMBULATORY_CARE_PROVIDER_SITE_OTHER): Admitting: Internal Medicine

## 2023-06-21 VITALS — BP 175/100 | HR 110 | Resp 16 | Ht 76.0 in | Wt 234.0 lb

## 2023-06-21 DIAGNOSIS — I10 Essential (primary) hypertension: Secondary | ICD-10-CM | POA: Diagnosis not present

## 2023-06-21 DIAGNOSIS — Z7189 Other specified counseling: Secondary | ICD-10-CM | POA: Diagnosis not present

## 2023-06-21 DIAGNOSIS — G4710 Hypersomnia, unspecified: Secondary | ICD-10-CM

## 2023-06-21 DIAGNOSIS — G4733 Obstructive sleep apnea (adult) (pediatric): Secondary | ICD-10-CM

## 2023-06-21 MED ORDER — ARMODAFINIL 200 MG PO TABS: 1.0000 | ORAL_TABLET | Freq: Every day | ORAL | 1 refills | Status: DC

## 2023-06-21 NOTE — Patient Instructions (Signed)

## 2023-12-26 NOTE — Progress Notes (Unsigned)
 [facility] [address] [place], [place] [address]  Pulmonary Sleep Medicine   Office Visit Note  Patient Name: Gary Solis DOB: 07-22-1960 MRN [id number]    Chief Complaint: Obstructive Sleep Apnea visit  Brief History:  Prentis is seen today for a 6 month follow up on APAP @ 4-12 cmH2O. The patient has a 16 year history of sleep apnea. Patient is using PAP nightly.  The patient feels rested after sleeping with PAP.  The patient reports benefit from PAP use. Reported sleepiness is improved and the Epworth Sleepiness Score is 3 out of 24. The patient does occasionally take naps with PAP therapy,. The patient complains of the following: no complaints. The compliance download shows 81% compliance with an average use time of 5 hours 24 minutes. The AHI is 4.2  The patient does not complain of limb movements disrupting sleep. The patient's current machine is 61 years old and malfunctioning, it is out of warranty and obsolete for repairs, to assure reliable continued treatment for OSA a replacement machine is needed. Patient continues to take armodafinil  for daytime sleepiness and denies any side effects.  ROS  General: (-) fever, (-) chills, (-) night sweat Nose and Sinuses: (-) nasal stuffiness or itchiness, (-) postnasal drip, (-) nosebleeds, (-) sinus trouble. Mouth and Throat: (-) sore throat, (-) hoarseness. Neck: (-) swollen glands, (-) enlarged thyroid, (-) neck pain. Respiratory: - cough, - shortness of breath, - wheezing. Neurologic: + numbness, + tingling. Psychiatric: - anxiety, - depression   Current Medication: Outpatient Encounter Medications as of 12/27/2023  Medication Sig   amLODipine (NORVASC) 10 MG tablet Take 10 mg by mouth daily.   cyanocobalamin (VITAMIN B12) 1000 MCG/ML injection Inject 1,000 mcg into the muscle.   tirzepatide (MOUNJARO) 12.5 MG/0.5ML Pen Inject 12.5 mg into the skin.   amitriptyline (ELAVIL) 25 MG tablet Take by mouth.    Armodafinil  (NUVIGIL ) 200 MG TABS Take 1 tablet (200 mg total) by mouth daily.   azithromycin  (ZITHROMAX  Z-PAK) 250 MG tablet Take 2 tablets (500 mg) on  Day 1,  followed by 1 tablet (250 mg) once daily on Days 2 through 5.   cyclobenzaprine  (FLEXERIL ) 10 MG tablet Take 1 tablet (10 mg total) by mouth 3 (three) times daily as needed for muscle spasms.   ibuprofen  (ADVIL ,MOTRIN ) 600 MG tablet Take 1 tablet (600 mg total) by mouth every 6 (six) hours as needed.   lisinopril (ZESTRIL) 20 MG tablet Take 20 mg by mouth daily.   metFORMIN (GLUCOPHAGE-XR) 500 MG 24 hr tablet Take by mouth.   pregabalin (LYRICA) 75 MG capsule Take by mouth.   [DISCONTINUED] Armodafinil  (NUVIGIL ) 200 MG TABS Take 1 tablet (200 mg total) by mouth daily.   No facility-administered encounter medications on file as of 12/27/2023.    Surgical History: Past Surgical History:  Procedure Laterality Date   hemmorrhoid     NASAL SEPTUM SURGERY     PROSTATE SURGERY      Medical History: Past Medical History:  Diagnosis Date   Crohn disease (HCC)    OSA on CPAP    Sleep apnea     Family History: Non contributory to the present illness  Social History: Social History   Socioeconomic History   Marital status: Married    Spouse name: Not on file   Number of children: Not on file   Years of education: Not on file   Highest education level: Not on file  Occupational History   Not on file  Tobacco Use   Smoking status: Never   Smokeless tobacco: Never  Substance and Sexual Activity   Alcohol use: Never   Drug use: Never   Sexual activity: Not on file  Other Topics Concern   Not on file  Social History Narrative   Not on file   Social Drivers of Health   Tobacco Use: Low Risk (12/27/2023)   Patient History    Smoking Tobacco Use: Never    Smokeless Tobacco Use: Never    Passive Exposure: Not on file  Financial Resource Strain: Low Risk  (08/30/2023)   Received from [facility]    Overall Financial Resource Strain (CARDIA)    Difficulty of Paying Living Expenses: Not hard at all  Food Insecurity: No Food Insecurity (08/30/2023)   Received from [facility]   Epic    Within the past 12 months, you worried that your food would run out before you got the money to buy more.: Never true    Within the past 12 months, the food you bought just didn't last and you didn't have money to get more.: Never true  Transportation Needs: No Transportation Needs (08/30/2023)   Received from [facility] - Transportation    In the past 12 months, has lack of transportation kept you from medical appointments or from getting medications?: No    Lack of Transportation (Non-Medical): No  Physical Activity: Not on file  Stress: Not on file  Social Connections: Not on file  Intimate Partner Violence: Not on file  Depression (EYV7-0): Not on file  Alcohol Screen: Not on file  Housing: Low Risk  (08/30/2023)   Received from [facility]   Epic    In the last 12 months, was there a time when you were not able to pay the mortgage or rent on time?: No    In the past 12 months, how many times have you moved where you were living?: 0    At any time in the past 12 months, were you homeless or living in a shelter (including now)?: No  Utilities: Not At Risk (08/30/2023)   Received from [facility]   Epic    In the past 12 months has the electric, gas, oil, or water company threatened to shut off services in your home?: No  Health Literacy: Not on file    Vital Signs: Blood pressure (!) 152/82, pulse 98, resp. rate 16, height 6' 4 (1.93 m), weight 219 lb (99.3 kg), SpO2 94%. Body mass index is 26.66 kg/m.    Examination: General Appearance: The patient is well-developed, well-nourished, and in no distress. Neck Circumference: 41 cm Skin: Gross inspection of skin unremarkable. Head: normocephalic, no gross  deformities. Eyes: no gross deformities noted. ENT: ears appear grossly normal Neurologic: Alert and oriented. No involuntary movements.  STOP BANG RISK ASSESSMENT S (snore) Have you been told that you snore?     NO   T (tired) Are you often tired, fatigued, or sleepy during the day?   NO  O (obstruction) Do you stop breathing, choke, or gasp during sleep? NO   P (pressure) Do you have or are you being treated for high blood pressure? YES   B (BMI) Is your body index greater than 35 kg/m? NO   A (age) Are you 98 years old or older? YES   N (neck) Do you have a neck circumference greater than 16 inches?  YES   G (gender) Are you a male? YES   TOTAL STOP/BANG YES ANSWERS 4       A STOP-Bang score of 2 or less is considered low risk, and a score of 5 or more is high risk for having either moderate or severe OSA. For people who score 3 or 4, doctors may need to perform further assessment to determine how likely they are to have OSA.         EPWORTH SLEEPINESS SCALE:  Scale:  (0)= no chance of dozing; (1)= slight chance of dozing; (2)= moderate chance of dozing; (3)= high chance of dozing  Chance  Situtation    Sitting and reading: 0    Watching TV: 1    Sitting Inactive in public: 0    As a passenger in car: 2      Lying down to rest: 0    Sitting and talking: 1    Sitting quielty after lunch: 0    In a car, stopped in traffic: 0   TOTAL SCORE:   3 out of 24    SLEEP STUDIES:  Split (05/2007) AHI 37/hr, REM AHI 6/hr, Supine AHI 30/hr, Min Sp02 70%, CPAP@ 7cmH20 Split (05/2014) AHI 73/hr, Min Sp02 82%, CPAP@ 12 cmH20 Titration (04/2015) CPAP@ 16 cmH20 MWT (03/2016) Normal MWT HST (12/2016) AHI 7.1/hr, Min Sp02 77% MSLT/MWT (03/2017) Normal maintenance of wakefulness test w/ a normal sleep onset atency of 38:07 min with the use of Nuvigil  prior to the study HST (07/2021) AHI 9/hr, Min Sp02 75%   CPAP COMPLIANCE DATA:  Date Range: 04/09/2023 -  10/05/2023  Average Daily Use: 5 hours 24 minutes  Median Use: 5 hours 16 minutes  Compliance for > 4 Hours: 81% days  AHI: 4.2 respiratory events per hour  Days Used: 180/180  Mask Leak: 29.8  95th Percentile Pressure: 11.1 cmH2O         LABS: No results found for this or any previous visit (from the past 2160 hours).  Radiology: DG Chest Portable 1 View Result Date: 01/12/2019 CLINICAL DATA:  Cough. Additional provided: Cough beginning 01/04/2019. EXAM: PORTABLE CHEST 1 VIEW COMPARISON:  No pertinent prior studies available for comparison. FINDINGS: Heart size within normal limits. Mild ill-defined opacity at the right lung base may reflect atelectasis or early pneumonia. The left lung is clear. No evidence of pleural effusion or pneumothorax. No acute bony abnormality. IMPRESSION: Ill-defined opacity at the right lung base which may reflect atelectasis, but is suspicious for early pneumonia given provided history. Electronically Signed   By: Rockey Childs DO   On: 01/12/2019 17:59    No results found.  No results found.    Assessment and Plan: Patient Active Problem List   Diagnosis Date Noted   OSA on CPAP    Essential hypertension 02/18/2013   Anxiety 07/10/2010      The patient does tolerate PAP and reports benefit from PAP use. The patient was reminded how to adjust mask fit and advised to change supplies regularly. The patient was also counselled on nightly use. The compliance is good. The AHI is 4.2. Patient continues to require PAP to treat their apnea and is medically necessary. The patient's machine is past end of life and must be replaced. Will adjust to APAP 4-14cm H2O due to nearing max on range and apnea a little higher recently.   1. OSA on CPAP (Primary) Follow up 30+ days after set up.  2. CPAP use counseling CPAP couseling-Discussed  importance of adequate CPAP use as well as proper care and cleaning techniques of machine and all supplies.  3.  Hypersomnia May continue armodafinil  as before, refills given - Armodafinil  (NUVIGIL ) 200 MG TABS; Take 1 tablet (200 mg total) by mouth daily.  Dispense: 90 tablet; Refill: 1  4. Essential hypertension Continue current medication and f/u with PCP.    General Counseling: I have discussed the findings of the evaluation and examination with [facility].  I have also discussed any further diagnostic evaluation thatmay be needed or ordered today. Marie verbalizes understanding of the findings of todays visit. We also reviewed his medications today and discussed drug interactions and side effects including but not limited excessive drowsiness and altered mental states. We also discussed that there is always a risk not just to him but also people around him. he has been encouraged to call the office with any questions or concerns that should arise related to todays visit.  No orders of the defined types were placed in this encounter.       I have personally obtained a history, examined the patient, evaluated laboratory and imaging results, formulated the assessment and plan and placed orders.  This patient was seen by Tinnie Pro, PA-C in collaboration with Dr. Elfreda Bathe as a part of collaborative care agreement.  Elfreda DELENA Bathe, MD [facility] Diplomate ABMS Pulmonary Critical Care Medicine and Sleep Medicine

## 2023-12-27 ENCOUNTER — Ambulatory Visit: Admitting: Internal Medicine

## 2023-12-27 VITALS — BP 152/82 | HR 98 | Resp 16 | Ht 76.0 in | Wt 219.0 lb

## 2023-12-27 DIAGNOSIS — G4733 Obstructive sleep apnea (adult) (pediatric): Secondary | ICD-10-CM | POA: Diagnosis not present

## 2023-12-27 DIAGNOSIS — Z7189 Other specified counseling: Secondary | ICD-10-CM | POA: Diagnosis not present

## 2023-12-27 DIAGNOSIS — I10 Essential (primary) hypertension: Secondary | ICD-10-CM | POA: Diagnosis not present

## 2023-12-27 DIAGNOSIS — G4710 Hypersomnia, unspecified: Secondary | ICD-10-CM | POA: Diagnosis not present

## 2023-12-27 MED ORDER — ARMODAFINIL 200 MG PO TABS: 1.0000 | ORAL_TABLET | Freq: Every day | ORAL | 1 refills | Status: AC

## 2023-12-27 NOTE — Patient Instructions (Signed)
